# Patient Record
Sex: Female | Born: 1940 | Race: Black or African American | Hispanic: No | Smoking: Former smoker
Health system: Southern US, Community
[De-identification: ages and names within clinical notes are randomized; demographics above are authoritative.]

## PROBLEM LIST (undated history)

## (undated) DIAGNOSIS — IMO0001 Reserved for inherently not codable concepts without codable children: Secondary | ICD-10-CM

## (undated) DIAGNOSIS — M1990 Unspecified osteoarthritis, unspecified site: Secondary | ICD-10-CM

## (undated) DIAGNOSIS — C50919 Malignant neoplasm of unspecified site of unspecified female breast: Secondary | ICD-10-CM

## (undated) DIAGNOSIS — IMO0002 Reserved for concepts with insufficient information to code with codable children: Secondary | ICD-10-CM

## (undated) DIAGNOSIS — Z923 Personal history of irradiation: Secondary | ICD-10-CM

## (undated) DIAGNOSIS — I499 Cardiac arrhythmia, unspecified: Secondary | ICD-10-CM

## (undated) DIAGNOSIS — E119 Type 2 diabetes mellitus without complications: Secondary | ICD-10-CM

## (undated) DIAGNOSIS — I10 Essential (primary) hypertension: Secondary | ICD-10-CM

## (undated) HISTORY — PX: ABDOMINAL HYSTERECTOMY: SHX81

## (undated) HISTORY — DX: Type 2 diabetes mellitus without complications: E11.9

## (undated) HISTORY — DX: Essential (primary) hypertension: I10

## (undated) HISTORY — DX: Reserved for concepts with insufficient information to code with codable children: IMO0002

## (undated) HISTORY — DX: Reserved for inherently not codable concepts without codable children: IMO0001

## (undated) HISTORY — DX: Malignant neoplasm of unspecified site of unspecified female breast: C50.919

## (undated) HISTORY — PX: EYE SURGERY: SHX253

---

## 1998-11-26 ENCOUNTER — Other Ambulatory Visit: Admission: RE | Admit: 1998-11-26 | Discharge: 1998-11-26 | Payer: Self-pay | Admitting: Family Medicine

## 1998-12-09 ENCOUNTER — Ambulatory Visit (HOSPITAL_COMMUNITY): Admission: RE | Admit: 1998-12-09 | Discharge: 1998-12-09 | Payer: Self-pay | Admitting: Family Medicine

## 1998-12-09 ENCOUNTER — Encounter: Payer: Self-pay | Admitting: Family Medicine

## 2000-02-10 ENCOUNTER — Encounter: Payer: Self-pay | Admitting: Family Medicine

## 2000-02-10 ENCOUNTER — Ambulatory Visit (HOSPITAL_COMMUNITY): Admission: RE | Admit: 2000-02-10 | Discharge: 2000-02-10 | Payer: Self-pay | Admitting: Family Medicine

## 2001-02-13 ENCOUNTER — Ambulatory Visit (HOSPITAL_COMMUNITY): Admission: RE | Admit: 2001-02-13 | Discharge: 2001-02-13 | Payer: Self-pay | Admitting: Family Medicine

## 2001-02-13 ENCOUNTER — Encounter: Payer: Self-pay | Admitting: Family Medicine

## 2003-10-28 ENCOUNTER — Ambulatory Visit (HOSPITAL_COMMUNITY): Admission: RE | Admit: 2003-10-28 | Discharge: 2003-10-28 | Payer: Self-pay | Admitting: Family Medicine

## 2003-11-05 ENCOUNTER — Encounter: Admission: RE | Admit: 2003-11-05 | Discharge: 2003-11-05 | Payer: Self-pay | Admitting: Family Medicine

## 2003-11-07 ENCOUNTER — Encounter: Admission: RE | Admit: 2003-11-07 | Discharge: 2003-11-07 | Payer: Self-pay | Admitting: Family Medicine

## 2003-11-07 ENCOUNTER — Encounter (INDEPENDENT_AMBULATORY_CARE_PROVIDER_SITE_OTHER): Payer: Self-pay | Admitting: Specialist

## 2004-03-04 ENCOUNTER — Encounter: Admission: RE | Admit: 2004-03-04 | Discharge: 2004-03-04 | Payer: Self-pay | Admitting: Family Medicine

## 2004-12-08 ENCOUNTER — Encounter: Admission: RE | Admit: 2004-12-08 | Discharge: 2004-12-08 | Payer: Self-pay | Admitting: Family Medicine

## 2005-12-09 ENCOUNTER — Encounter: Admission: RE | Admit: 2005-12-09 | Discharge: 2005-12-09 | Payer: Self-pay | Admitting: Family Medicine

## 2006-12-12 ENCOUNTER — Encounter: Admission: RE | Admit: 2006-12-12 | Discharge: 2006-12-12 | Payer: Self-pay | Admitting: Family Medicine

## 2007-01-11 ENCOUNTER — Encounter: Admission: RE | Admit: 2007-01-11 | Discharge: 2007-01-19 | Payer: Self-pay | Admitting: Family Medicine

## 2007-12-13 ENCOUNTER — Encounter: Admission: RE | Admit: 2007-12-13 | Discharge: 2007-12-13 | Payer: Self-pay | Admitting: Family Medicine

## 2008-12-13 ENCOUNTER — Encounter: Admission: RE | Admit: 2008-12-13 | Discharge: 2008-12-13 | Payer: Self-pay | Admitting: Family Medicine

## 2009-12-15 ENCOUNTER — Encounter: Admission: RE | Admit: 2009-12-15 | Discharge: 2009-12-15 | Payer: Self-pay | Admitting: Family Medicine

## 2010-11-13 ENCOUNTER — Other Ambulatory Visit: Payer: Self-pay | Admitting: Family Medicine

## 2010-11-13 DIAGNOSIS — Z1231 Encounter for screening mammogram for malignant neoplasm of breast: Secondary | ICD-10-CM

## 2010-12-17 ENCOUNTER — Ambulatory Visit
Admission: RE | Admit: 2010-12-17 | Discharge: 2010-12-17 | Disposition: A | Discharge status: Home / Self Care | Payer: Medicare Other | Source: Ambulatory Visit | Attending: Family Medicine | Admitting: Family Medicine

## 2010-12-17 DIAGNOSIS — Z1231 Encounter for screening mammogram for malignant neoplasm of breast: Secondary | ICD-10-CM

## 2011-11-10 ENCOUNTER — Other Ambulatory Visit: Payer: Self-pay | Admitting: Family Medicine

## 2011-11-10 DIAGNOSIS — Z1231 Encounter for screening mammogram for malignant neoplasm of breast: Secondary | ICD-10-CM

## 2011-12-20 ENCOUNTER — Ambulatory Visit
Admission: RE | Admit: 2011-12-20 | Discharge: 2011-12-20 | Disposition: A | Discharge status: Home / Self Care | Payer: Medicare Other | Source: Ambulatory Visit | Attending: Family Medicine | Admitting: Family Medicine

## 2011-12-20 DIAGNOSIS — Z1231 Encounter for screening mammogram for malignant neoplasm of breast: Secondary | ICD-10-CM

## 2012-11-13 ENCOUNTER — Other Ambulatory Visit: Payer: Self-pay

## 2012-11-13 DIAGNOSIS — Z1231 Encounter for screening mammogram for malignant neoplasm of breast: Secondary | ICD-10-CM

## 2012-12-20 ENCOUNTER — Ambulatory Visit: Payer: Medicare Other

## 2012-12-21 ENCOUNTER — Other Ambulatory Visit: Payer: Self-pay

## 2012-12-21 ENCOUNTER — Ambulatory Visit
Admission: RE | Admit: 2012-12-21 | Discharge: 2012-12-21 | Disposition: A | Discharge status: Home / Self Care | Payer: Medicare Other | Source: Ambulatory Visit

## 2012-12-21 DIAGNOSIS — Z1231 Encounter for screening mammogram for malignant neoplasm of breast: Secondary | ICD-10-CM

## 2012-12-25 ENCOUNTER — Other Ambulatory Visit: Payer: Self-pay | Admitting: Family Medicine

## 2012-12-25 DIAGNOSIS — R928 Other abnormal and inconclusive findings on diagnostic imaging of breast: Secondary | ICD-10-CM

## 2013-01-05 ENCOUNTER — Other Ambulatory Visit: Payer: Self-pay

## 2013-01-05 ENCOUNTER — Ambulatory Visit
Admission: RE | Admit: 2013-01-05 | Discharge: 2013-01-05 | Disposition: A | Discharge status: Home / Self Care | Payer: Medicare Other | Source: Ambulatory Visit | Attending: Family Medicine | Admitting: Family Medicine

## 2013-01-05 DIAGNOSIS — R928 Other abnormal and inconclusive findings on diagnostic imaging of breast: Secondary | ICD-10-CM

## 2013-01-05 DIAGNOSIS — R921 Mammographic calcification found on diagnostic imaging of breast: Secondary | ICD-10-CM

## 2013-01-08 ENCOUNTER — Ambulatory Visit
Admission: RE | Admit: 2013-01-08 | Discharge: 2013-01-08 | Disposition: A | Discharge status: Home / Self Care | Payer: Medicare Other | Source: Ambulatory Visit

## 2013-01-08 DIAGNOSIS — R921 Mammographic calcification found on diagnostic imaging of breast: Secondary | ICD-10-CM

## 2013-01-10 ENCOUNTER — Other Ambulatory Visit: Payer: Self-pay | Admitting: Family Medicine

## 2013-01-10 DIAGNOSIS — D0512 Intraductal carcinoma in situ of left breast: Secondary | ICD-10-CM

## 2013-01-11 ENCOUNTER — Other Ambulatory Visit: Payer: Self-pay | Admitting: *Deleted

## 2013-01-11 ENCOUNTER — Telehealth: Payer: Self-pay | Admitting: *Deleted

## 2013-01-11 DIAGNOSIS — C50512 Malignant neoplasm of lower-outer quadrant of left female breast: Secondary | ICD-10-CM | POA: Insufficient documentation

## 2013-01-11 NOTE — Telephone Encounter (Signed)
Confirmed [facility] for 01/17/13 at 0800.  Instructions and contact information given.  Pt denies traveling to [place] or being around anyone that has done so in the last 30 days.

## 2013-01-17 ENCOUNTER — Encounter: Payer: Self-pay | Admitting: *Deleted

## 2013-01-17 ENCOUNTER — Ambulatory Visit
Admission: RE | Admit: 2013-01-17 | Discharge: 2013-01-17 | Disposition: A | Discharge status: Home / Self Care | Payer: Medicare Other | Source: Ambulatory Visit | Attending: Radiation Oncology | Admitting: Radiation Oncology

## 2013-01-17 ENCOUNTER — Other Ambulatory Visit (INDEPENDENT_AMBULATORY_CARE_PROVIDER_SITE_OTHER): Payer: Self-pay | Admitting: General Surgery

## 2013-01-17 ENCOUNTER — Ambulatory Visit (HOSPITAL_BASED_OUTPATIENT_CLINIC_OR_DEPARTMENT_OTHER): Payer: Medicare Other | Admitting: General Surgery

## 2013-01-17 ENCOUNTER — Ambulatory Visit: Payer: Medicare Other | Admitting: Physical Therapy

## 2013-01-17 ENCOUNTER — Ambulatory Visit (HOSPITAL_BASED_OUTPATIENT_CLINIC_OR_DEPARTMENT_OTHER): Payer: Medicare Other

## 2013-01-17 ENCOUNTER — Other Ambulatory Visit (HOSPITAL_BASED_OUTPATIENT_CLINIC_OR_DEPARTMENT_OTHER): Payer: Medicare Other

## 2013-01-17 ENCOUNTER — Encounter: Payer: Self-pay | Admitting: Oncology

## 2013-01-17 ENCOUNTER — Ambulatory Visit (HOSPITAL_BASED_OUTPATIENT_CLINIC_OR_DEPARTMENT_OTHER): Payer: Medicare Other | Admitting: Oncology

## 2013-01-17 VITALS — BP 172/95 | HR 101 | Temp 98.6°F | Resp 20 | Ht 63.0 in | Wt 193.9 lb

## 2013-01-17 DIAGNOSIS — C50519 Malignant neoplasm of lower-outer quadrant of unspecified female breast: Secondary | ICD-10-CM

## 2013-01-17 DIAGNOSIS — C50512 Malignant neoplasm of lower-outer quadrant of left female breast: Secondary | ICD-10-CM

## 2013-01-17 DIAGNOSIS — D0590 Unspecified type of carcinoma in situ of unspecified breast: Secondary | ICD-10-CM

## 2013-01-17 DIAGNOSIS — C50919 Malignant neoplasm of unspecified site of unspecified female breast: Secondary | ICD-10-CM

## 2013-01-17 DIAGNOSIS — C50912 Malignant neoplasm of unspecified site of left female breast: Secondary | ICD-10-CM

## 2013-01-17 DIAGNOSIS — Z170 Estrogen receptor positive status [ER+]: Secondary | ICD-10-CM

## 2013-01-17 LAB — COMPREHENSIVE METABOLIC PANEL (CC13)
ALT: 11 U/L (ref 0–55)
Albumin: 3.8 g/dL (ref 3.5–5.0)
Anion Gap: 13 mEq/L — ABNORMAL HIGH (ref 3–11)
Calcium: 10.1 mg/dL (ref 8.4–10.4)
Sodium: 138 mEq/L (ref 136–145)
Total Protein: 8.6 g/dL — ABNORMAL HIGH (ref 6.4–8.3)

## 2013-01-17 LAB — CBC WITH DIFFERENTIAL/PLATELET
BASO%: 0.7 % (ref 0.0–2.0)
EOS%: 0.7 % (ref 0.0–7.0)
Eosinophils Absolute: 0 10*3/uL (ref 0.0–0.5)
HGB: 14.6 g/dL (ref 11.6–15.9)
MCH: 30.8 pg (ref 25.1–34.0)
MCHC: 33.7 g/dL (ref 31.5–36.0)
MONO%: 10.3 % (ref 0.0–14.0)
Platelets: 435 10*3/uL — ABNORMAL HIGH (ref 145–400)
RBC: 4.74 10*6/uL (ref 3.70–5.45)
RDW: 13.9 % (ref 11.2–14.5)

## 2013-01-17 NOTE — Progress Notes (Signed)
Checked in new patient with no financial issues. She had two insurance cards. She will ck to see which is correct, but she paid the copay 45.00 instead of 40.00. Gave appt card and [facility] packet-she didn't have one.

## 2013-01-17 NOTE — Patient Instructions (Signed)
My office will call to schedule your surgery

## 2013-01-17 NOTE — Addendum Note (Signed)
Encounter addended by: Lurline Hare, MD on: 01/17/2013 11:34 AM<BR>     Documentation filed: Notes Section

## 2013-01-17 NOTE — Progress Notes (Addendum)
Radiation Oncology         [phone]) [phone] ________________________________  Initial outpatient Consultation - Date: 01/17/2013   Name: Erica Ballard MRN: [id number]   DOB: 1941-03-26  REFERRING PHYSICIAN: Adolph Pollack, MD  DIAGNOSIS: DCIS of the left breast  HISTORY OF PRESENT ILLNESS::Erica Ballard is a 72 y.o. female  who underwent a screening mammogram. She was found to have calcifications in the lower outer quadrant of the left breast. Is measured 2.8 x 2.7 x 2.8 cm. 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 The DCIS was ER positive PR positive. An MRI is scheduled for Friday. She has no history of breast cancer no symptoms prior to her mammogram. She has slight pain and itching in her left breast. She is accompanied by her daughter today. He is GX P4 with her first live birth at 55. She never used birth control pills. She never used hormone replacement. She had menses at 11 and had a hysterectomy in 1972.Marland Kitchen  PREVIOUS RADIATION THERAPY: No  PAST MEDICAL HISTORY:  has a past medical history of Breast cancer; Diabetes mellitus without complication; and Hypertension.    PAST SURGICAL HISTORY: Past Surgical History  Procedure Laterality Date  . Abdominal hysterectomy      FAMILY HISTORY:  Family History  Problem Relation Age of Onset  . Lung cancer Sister     SOCIAL HISTORY:  History  Substance Use Topics  . Smoking status: Former Games developer  . Smokeless tobacco: Not on file  . Alcohol Use: Yes    ALLERGIES: Review of patient's allergies indicates no known allergies.  MEDICATIONS:  Current Outpatient Prescriptions  Medication Sig Dispense Refill  . amLODipine (NORVASC) 5 MG tablet Take 5 mg by mouth 2 (two) times daily.      . carvedilol (COREG) 6.25 MG tablet Take 6.25 mg by mouth 2 (two) times daily.      . hydrochlorothiazide (HYDRODIURIL) 25 MG tablet Take 25  mg by mouth daily.      Marland Kitchen JANUMET 50-500 MG per tablet Take 2 tablets by mouth daily.      . Lancets 30G MISC       . PNEUMOVAX 23 25 MCG/0.5ML injection       . spironolactone (ALDACTONE) 25 MG tablet Take 25 mg by mouth 2 (two) times daily.       No current facility-administered medications for this encounter.    REVIEW OF SYSTEMS:  A 15 point review of systems is documented in the electronic medical record. This was obtained by the nursing staff. However, I reviewed this with the patient to discuss relevant findings and make appropriate changes.  Pertinent items are noted in HPI.  PHYSICAL EXAM: There were no vitals filed for this visit.. . ECoG performance status 0 she is a pleasant female in no distress sitting comfortably on examining table. She is alert minus x3. She has no palpable cervical or supraclavicular adenopathy. No palpable right or left axillary adenopathy. She has ptotic breasts bilaterally. She has no palpable abnormalities of the right breast. The left breast in the lower outer quadrant she has some biopsy change.  LABORATORY DATA:  Lab Results  Component Value Date   WBC 6.6 01/17/2013   HGB 14.6 01/17/2013   HCT 43.4 01/17/2013   MCV 91.5 01/17/2013   PLT 435* 01/17/2013   No results found for this basename: NA, K, CL, CO2   No results found  for this basename: ALT, AST, GGT, ALKPHOS, BILITOT     RADIOGRAPHY: [employer] L  01/10/2013   ADDENDUM REPORT: 01/10/2013 12:38  ADDENDUM: I talked with the patient today by phone. She states she is doing well since her stereotactic left breast biopsy without problems at the biopsy site. I gave her results of her biopsy which was compatible with low grade DCIS with [facility]. The pathology results are concordant with the imaging findings. The patient's immediate questions and concerns were addressed. She was given an appointment to the [facility] 01/17/2013 and an appointment for breast MRI 01/19/2013 at 9:30 a.m. The she was  notified that a representative from the [facility] would be contacting her regarding further information for her appointment.   Electronically Signed   By: Elberta Fortis M.D.   On: 01/10/2013 12:38   01/10/2013   CLINICAL DATA:  The patient presents for stereotactic core needle biopsy of a group of indeterminate microcalcifications over the over lower left breast. Previous benign left breast stereotactic needle biopsy 2005.  EXAM: STEREOTACTIC CORE NEEDLE BIOPSY; DIGITAL DIAGNOSTIC UNILATERAL LEFT MAMMOGRAM LIMITED  COMPARISON:  Previous exams.  FINDINGS: I met with the patient and we discussed the procedure of stereotactic-guided biopsy, including benefits and alternatives. We discussed the high likelihood of a successful procedure. We discussed the risks of the procedure, including infection, bleeding, tissue injury, clip migration, and inadequate sampling. Informed, written consent was given.  Using sterile technique and 2% Lidocaine as local anesthetic, under stereotactic guidance, a 12 gauge vacuum assisted device was used to perform core needle biopsy of calcifications in the lower outer quadrant of the left breast using a lateral to medial approach. Specimen radiograph was performed, showing multiple of the targeted microcalcifications. Specimens with calcifications are identified for pathology.  At the conclusion of the procedure, a hourglass shaped tissue marker clip was deployed into the biopsy cavity. Follow-up 2-view mammogram confirmed clip placement accurately at the biopsy site.  IMPRESSION: Stereotactic-guided biopsy of left breast microcalcifications. No apparent complications.  Electronically Signed: By: Elberta Fortis M.D. On: 01/08/2013 15:07   Mm Digital Screening  12/21/2012   *RADIOLOGY REPORT*  Clinical Data: Screening.  History of benign stereotactic breast biopsy on the left for calcifications in August 2005  DIGITAL SCREENING BILATERAL MAMMOGRAM WITH CAD  Comparison:  Previous exam(s).   FINDINGS:  ACR Breast Density Category c:  The breast tissue is heterogeneously dense, which may obscure small masses.  In the left breast, calcifications warrant further evaluation with magnified views.  In the right breast, there are no findings suspicious for malignancy.  Images were processed with CAD.  IMPRESSION: Further evaluation is suggested for calcifications in the left breast.  RECOMMENDATION: Diagnostic mammogram of the left breast. (Code:FI-L-9M)  The patient will be contacted regarding the findings, and additional imaging will be scheduled.  BI-RADS CATEGORY 0:  Incomplete.  Need additional imaging evaluation and/or prior mammograms for comparison.   Original Report Authenticated By: Rhodia Albright, M.D.   Mm Lt Breast Bx W Loc Dev 1st Lesion Image Bx Spec Stereo Guide  01/10/2013   ADDENDUM REPORT: 01/10/2013 12:38  ADDENDUM: I talked with the patient today by phone. She states she is doing well since her stereotactic left breast biopsy without problems at the biopsy site. I gave her results of her biopsy which was compatible with low grade DCIS with [facility]. The pathology results are concordant with the imaging findings. The patient's immediate questions and concerns were addressed. She was given  an appointment to the [facility] 01/17/2013 and an appointment for breast MRI 01/19/2013 at 9:30 a.m. The she was notified that a representative from the [facility] would be contacting her regarding further information for her appointment.   Electronically Signed   By: Elberta Fortis M.D.   On: 01/10/2013 12:38   01/10/2013   CLINICAL DATA:  The patient presents for stereotactic core needle biopsy of a group of indeterminate microcalcifications over the over lower left breast. Previous benign left breast stereotactic needle biopsy 2005.  EXAM: STEREOTACTIC CORE NEEDLE BIOPSY; DIGITAL DIAGNOSTIC UNILATERAL LEFT MAMMOGRAM LIMITED  COMPARISON:  Previous exams.  FINDINGS: I met with the patient and we discussed the  procedure of stereotactic-guided biopsy, including benefits and alternatives. We discussed the high likelihood of a successful procedure. We discussed the risks of the procedure, including infection, bleeding, tissue injury, clip migration, and inadequate sampling. Informed, written consent was given.  Using sterile technique and 2% Lidocaine as local anesthetic, under stereotactic guidance, a 12 gauge vacuum assisted device was used to perform core needle biopsy of calcifications in the lower outer quadrant of the left breast using a lateral to medial approach. Specimen radiograph was performed, showing multiple of the targeted microcalcifications. Specimens with calcifications are identified for pathology.  At the conclusion of the procedure, a hourglass shaped tissue marker clip was deployed into the biopsy cavity. Follow-up 2-view mammogram confirmed clip placement accurately at the biopsy site.  IMPRESSION: Stereotactic-guided biopsy of left breast microcalcifications. No apparent complications.  Electronically Signed: By: Elberta Fortis M.D. On: 01/08/2013 15:07      IMPRESSION: DCIS of the left breast  PLAN: I discussed with the patient and her daughter the role of radiation and decreasing local failures in patients who undergo breast conservation. We discussed the process of simulation the placement tattoos. We discussed 4-6 weeks of treatment as an outpatient. We discussed skin darkening and fatigue as the most common side effects. We discussed use of breath hold technique for cardiac sparing. We discussed the low rate of secondary malignancies. She will meet with surgery and medical oncology. She also met with a member of our patient family support staff. We discussed her excellent prognosis.  I spent 40 minutes  face to face with the patient and more than 50% of that time was spent in counseling and/or coordination of care.   ------------------------------------------------  Lurline Hare,  MD

## 2013-01-17 NOTE — Progress Notes (Signed)
Patient ID: Erica Ballard, female   DOB: 03/01/1941, 72 y.o.   MRN: [id number]  No chief complaint on file.   HPI Erica Ballard is a 72 y.o. female.   HPI   She is referred by Dr. Boyle because of newly diagnosed left breast ductal carcinoma in situ in the lower outer quadrant of the left breast.  She was noted to have an area of suspicious microcalcifications spanning 2.8 cm I'm screening mammogram. Image guided biopsy demonstrated low-grade ductal carcinoma in situ; it is ER and PR positive.  She is interested in breast conservation treatment. Age at menarche was 11, first live birth was at 22, she had a hysterectomy in 1972 and has stopped having periods. She denies using hormone replacement therapy. No family history of breast cancer. She has no breast complaints. She is here with her daughter.  Past Medical History  Diagnosis Date  . Breast cancer   . Diabetes mellitus without complication   . Hypertension     Past Surgical History  Procedure Laterality Date  . Abdominal hysterectomy      Family History  Problem Relation Age of Onset  . Lung cancer Sister     Social History History  Substance Use Topics  . Smoking status: Former Smoker  . Smokeless tobacco: Not on file  . Alcohol Use: Yes    No Known Allergies  Current Outpatient Prescriptions  Medication Sig Dispense Refill  . amLODipine (NORVASC) 5 MG tablet Take 5 mg by mouth 2 (two) times daily.      . carvedilol (COREG) 6.25 MG tablet Take 6.25 mg by mouth 2 (two) times daily.      . hydrochlorothiazide (HYDRODIURIL) 25 MG tablet Take 25 mg by mouth daily.      . JANUMET 50-500 MG per tablet Take 2 tablets by mouth daily.      . Lancets 30G MISC       . PNEUMOVAX 23 25 MCG/0.5ML injection       . spironolactone (ALDACTONE) 25 MG tablet Take 25 mg by mouth 2 (two) times daily.       No current facility-administered medications for this visit.    Review of Systems Review of Systems  Constitutional:  Negative.   HENT: Negative.   Respiratory: Negative.   Cardiovascular: Negative.   Gastrointestinal: Negative.   Genitourinary: Negative.   Musculoskeletal: Negative.   Neurological: Negative.   Hematological: Negative.     There were no vitals taken for this visit.  Physical Exam Physical Exam  Constitutional: She appears well-developed and well-nourished. No distress.  HENT:  Head: Normocephalic and atraumatic.  Eyes: EOM are normal. No scleral icterus.  Neck: Neck supple.  Cardiovascular: Normal rate and regular rhythm.   Pulmonary/Chest: Effort normal and breath sounds normal.  Right breast demonstrates no dominant palpable masses or suspicious skin changes. Left breast demonstrates a lower outer quadrant puncture wound with no palpable masses.  Abdominal: Soft. She exhibits no mass. There is no tenderness.  Lower transverse scar.  Musculoskeletal: She exhibits no edema.  No axillary or supraclavicular adenopathy.  Lymphadenopathy:    She has no cervical adenopathy.  Neurological: She is alert.  Skin: Skin is warm and dry.  Psychiatric: She has a normal mood and affect. Her behavior is normal.    Data Reviewed Mammogram, ultrasound.  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 MRI is due to be done on the 24th of this month. The   patient still wants to have the MRI. She is interested in breast conservation therapy. I think this is reasonable but I did tell her that she may have a cosmetic deformity and she is okay with this. She does not want a mastectomy. I do not think she is a sentinel lymph node biopsy.     Plan    Left breast lumpectomy after wire localization.  I have explained the procedure, risks, and aftercare to her.  Risks include but are not limited to bleeding, infection, wound problems, seroma formation, anesthesia.  She seems to understand and agrees with the plan.        Neco Kling J 01/17/2013, 11:18  AM    

## 2013-01-19 ENCOUNTER — Other Ambulatory Visit: Payer: Self-pay | Admitting: Oncology

## 2013-01-19 ENCOUNTER — Telehealth: Payer: Self-pay | Admitting: Oncology

## 2013-01-19 ENCOUNTER — Ambulatory Visit
Admission: RE | Admit: 2013-01-19 | Discharge: 2013-01-19 | Disposition: A | Discharge status: Home / Self Care | Payer: Medicare Other | Source: Ambulatory Visit | Attending: Family Medicine | Admitting: Family Medicine

## 2013-01-19 DIAGNOSIS — D0512 Intraductal carcinoma in situ of left breast: Secondary | ICD-10-CM

## 2013-01-19 MED ORDER — GADOBENATE DIMEGLUMINE 529 MG/ML IV SOLN
17.0000 mL | Freq: Once | INTRAVENOUS | Status: AC | PRN
  Administered 2013-01-19: 17 mL via INTRAVENOUS

## 2013-01-19 NOTE — Telephone Encounter (Signed)
lvmm appt d/t per Dr Princella Pellegrini POF dated 01/17/13 cal mailed also shh

## 2013-01-22 ENCOUNTER — Telehealth: Payer: Self-pay | Admitting: *Deleted

## 2013-01-22 NOTE — Telephone Encounter (Signed)
Spoke to pt concerning [facility] from 01/17/13.  Pt denies questions or concerns regarding dx or treatment care plan.  Pt asked when she will be having surgery.  Informed pt that Dr. Abbey Chatters will be reviewing her breast MRI and she should be scheduled by tomorrow.  Pt denies further needs.  Encourage pt to call with question or concerns.  Received verbal understanding.  Contact information given.

## 2013-01-22 NOTE — Progress Notes (Signed)
ID: Domenic Schwab OB: 1941/03/22  MR#: [id number]  CSN#:[id number]  PCP: Geraldo Pitter, MD GYN:   SU:  OTHER MD:  CHIEF COMPLAINT: "I think I breast cancer".  HISTORY OF PRESENT ILLNESS: The patient has yearly screening mammography, and on her screening mammogram at the breast center 12/21/2012 new calcifications in the left lower breast were noted. 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  Breast MRI was obtained 01/19/2013 and showed an area of spiculated abnormal enhancement in the lateral lower position of the left breast measuring 3.2 cm maximally. There was no abnormal appearing adenopathy. Iincidentally focally increased signal in the right manubrium and medial right clavicle was noted.   The patient's subsequent history is as detailed below.  INTERVAL HISTORY: Ellisha was evaluated in the multidisciplinary breast cancer clinic 01/18/2013 accompanied by her daughter [name].  REVIEW OF SYSTEMS: The patient had no symptoms leading to the screening mammogram, which was routinely scheduled. She denies unusual headaches, visual changes, dizziness, nausea, vomiting, cough, phlegm production, pleurisy, shortness of breath, hemoptysis, chest pain or pressure, or any change in bowel or bladder habits. There has not been any unexplained fatigue or weight loss, bleeding, fever, or rash. A detailed review of systems today was unremarkable.  PAST MEDICAL HISTORY: Past Medical History  Diagnosis Date  . Breast cancer   . Diabetes mellitus without complication   . Hypertension     PAST SURGICAL HISTORY: Past Surgical History  Procedure Laterality Date  . Abdominal hysterectomy      FAMILY HISTORY Family History  Problem Relation Age of Onset  . Lung cancer Sister    the patient has very little information about her parents.  She has 3 brothers and 2 sisters. One sister died at the age of 78 from lung cancer. There is no history of breast or ovarian cancer in the family to the patient's knowledge.  GYNECOLOGIC HISTORY:  Menarche age 49, first live birth age 39. The patient is GX P4. She stopped having periods approximately 1972. She never took hormone replacement. She never took contraceptives.  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 She is widowed. Her 4 children are: [facility] who works as a Medical laboratory scientific officer in [place]; Quintella Baton, who is a Acupuncturist; Carroll Sage, who is an Personnel officer; and Britlyn Martine who is a Company secretary. The patient has 4 grandchildren and one great-grandchild. She attends a local [employer]  ADVANCED DIRECTIVES: Not in place; the patient is considering name and her daughter [name] as healthcare power of attorney. Or you can be reached at [phone], [phone], or [phone].   HEALTH MAINTENANCE: History  Substance Use Topics  . Smoking status: Former Games developer  . Smokeless tobacco: Not on file  . Alcohol Use: Yes     Colonoscopy: 2009  PAP: 2010  Bone density: 2005, at [facility] hospital; normal  Lipid panel:  No Known Allergies  Current Outpatient Prescriptions  Medication Sig Dispense Refill  . amLODipine (NORVASC) 5 MG tablet Take 5 mg by mouth 2 (two) times daily.      . carvedilol (COREG) 6.25 MG tablet Take 6.25 mg by mouth 2 (two) times daily.      . hydrochlorothiazide (HYDRODIURIL) 25 MG tablet Take 25 mg by mouth daily.      Marland Kitchen JANUMET 50-500 MG per tablet  Take 2 tablets by mouth daily.      . Lancets 30G MISC       . PNEUMOVAX 23 25 MCG/0.5ML injection       . spironolactone (ALDACTONE) 25 MG tablet Take 25 mg by mouth 2 (two) times daily.       No current facility-administered medications for this visit.    OBJECTIVE: Older African American woman in no acute distress Filed Vitals:   01/17/13 0900  BP:  172/95  Pulse: 101  Temp: 98.6 F (37 C)  Resp: 20     Body mass index is 34.36 kg/(m^2).    ECOG FS:0 - Asymptomatic  Ocular: Sclerae unicteric, pupils equal, round and reactive to light Ear-nose-throat: Oropharynx clear, dentition fair Lymphatic: No cervical or supraclavicular adenopathy Lungs no rales or rhonchi, good excursion bilaterally Heart regular rate and rhythm, no murmur appreciated Abd soft, nontender, positive bowel sounds MSK no focal spinal tenderness, no joint edema; the manubrium and right clavicle were not tender to palpation Neuro: non-focal, well-oriented, appropriate affect Breasts: The right breast is unremarkable. The left breast is status post recent biopsy. I do not palpate a well-defined mass. The left axilla is benign   LAB RESULTS:  CMP     Component Value Date/Time   NA 138 01/17/2013 0814   K 3.8 01/17/2013 0814   CO2 23 01/17/2013 0814   GLUCOSE 118 01/17/2013 0814   BUN 18.1 01/17/2013 0814   CREATININE 1.1 01/17/2013 0814   CALCIUM 10.1 01/17/2013 0814   PROT 8.6* 01/17/2013 0814   ALBUMIN 3.8 01/17/2013 0814   AST 13 01/17/2013 0814   ALT 11 01/17/2013 0814   ALKPHOS 48 01/17/2013 0814   BILITOT 0.47 01/17/2013 0814    I No results found for this basename: SPEP, UPEP,  kappa and lambda light chains    Lab Results  Component Value Date   WBC 6.6 01/17/2013   NEUTROABS 4.5 01/17/2013   HGB 14.6 01/17/2013   HCT 43.4 01/17/2013   MCV 91.5 01/17/2013   PLT 435* 01/17/2013      Chemistry      Component Value Date/Time   NA 138 01/17/2013 0814   K 3.8 01/17/2013 0814   CO2 23 01/17/2013 0814   BUN 18.1 01/17/2013 0814   CREATININE 1.1 01/17/2013 0814      Component Value Date/Time   CALCIUM 10.1 01/17/2013 0814   ALKPHOS 48 01/17/2013 0814   AST 13 01/17/2013 0814   ALT 11 01/17/2013 0814   BILITOT 0.47 01/17/2013 0814       No results found for this basename: LABCA2    No components found with this basename:  LABCA125    No results found for this basename: INR,  in the last 168 hours  Urinalysis No results found for this basename: colorurine, appearanceur, labspec, phurine, glucoseu, hgbur, bilirubinur, ketonesur, proteinur, urobilinogen, nitrite, leukocytesur    STUDIES: Mr Breast Bilateral W Wo Contrast  01/19/2013   CLINICAL DATA:  Recent diagnosis of left breast cancer.  EXAM: MR BILATERAL BREAST WITHOUT AND WITH CONTRAST  LABS:  BUN and creatinine were obtained on site at [facility] at  [address]  Results:  BUN 16 mg/dL,  Creatinine 0.9 mg/dL. GFR is 74  TECHNIQUE: Multiplanar, multisequence MR images of both breasts were obtained prior to and following the intravenous administration of 17ml of MultiHance.  THREE-DIMENSIONAL MR IMAGE RENDERING ON INDEPENDENT WORKSTATION:  Three-dimensional MR images were rendered by post-processing of the original  MR data on an independent workstation. The three-dimensional MR images were interpreted, and findings are reported in the following complete MRI report for this study.  COMPARISON:  Previous exams  FINDINGS: Breast composition: c:  Heterogeneous fibroglandular tissue  Background parenchymal enhancement: Moderate  Right breast: No mass or abnormal enhancement.  Left breast: There is spiculated abnormal enhancement in the lateral lower posterior 1/3 left breast measuring 2.3 diameter, 3.2 anterior-posterior, 3 cm craniocaudal dimension with biopsy clip in place. This mass has washout kinetics. This is consistent with patient's known breast cancer.  Lymph nodes: No abnormal appearing lymph nodes.  Ancillary findings: There is focal increased T2 signal in the right-sided manubrium and medial right clavicle. This is nonspecific. If clinically indicated, bone scan or dedicated MR can be obtained for further evaluation.  IMPRESSION: Known left breast cancer measuring 2.3 x 3.2 x 3 cm as described. Focal abnormal increased T2 signal in the right side  manubrium and medial right clavicle, nonspecific.  RECOMMENDATION: Treatment plan.  BI-RADS CATEGORY  6: Known biopsy-proven malignancy - appropriate action should be taken.   Electronically Signed   By: Sherian Rein M.D.   On: 01/19/2013 12:56   Mm [employer] L  01/10/2013   ADDENDUM REPORT: 01/10/2013 12:38  ADDENDUM: I talked with the patient today by phone. She states she is doing well since her stereotactic left breast biopsy without problems at the biopsy site. I gave her results of her biopsy which was compatible with low grade DCIS with [facility]. The pathology results are concordant with the imaging findings. The patient's immediate questions and concerns were addressed. She was given an appointment to the [facility] 01/17/2013 and an appointment for breast MRI 01/19/2013 at 9:30 a.m. The she was notified that a representative from the [facility] would be contacting her regarding further information for her appointment.   Electronically Signed   By: Elberta Fortis M.D.   On: 01/10/2013 12:38   01/10/2013   CLINICAL DATA:  The patient presents for stereotactic core needle biopsy of a group of indeterminate microcalcifications over the over lower left breast. Previous benign left breast stereotactic needle biopsy 2005.  EXAM: STEREOTACTIC CORE NEEDLE BIOPSY; DIGITAL DIAGNOSTIC UNILATERAL LEFT MAMMOGRAM LIMITED  COMPARISON:  Previous exams.  FINDINGS: I met with the patient and we discussed the procedure of stereotactic-guided biopsy, including benefits and alternatives. We discussed the high likelihood of a successful procedure. We discussed the risks of the procedure, including infection, bleeding, tissue injury, clip migration, and inadequate sampling. Informed, written consent was given.  Using sterile technique and 2% Lidocaine as local anesthetic, under stereotactic guidance, a 12 gauge vacuum assisted device was used to perform core needle biopsy of calcifications in the lower outer quadrant of the left breast  using a lateral to medial approach. Specimen radiograph was performed, showing multiple of the targeted microcalcifications. Specimens with calcifications are identified for pathology.  At the conclusion of the procedure, a hourglass shaped tissue marker clip was deployed into the biopsy cavity. Follow-up 2-view mammogram confirmed clip placement accurately at the biopsy site.  IMPRESSION: Stereotactic-guided biopsy of left breast microcalcifications. No apparent complications.  Electronically Signed: By: Elberta Fortis M.D. On: 01/08/2013 15:07   Mm Lt Breast Bx W Loc Dev 1st Lesion Image Bx Spec Stereo Guide  01/10/2013   ADDENDUM REPORT: 01/10/2013 12:38  ADDENDUM: I talked with the patient today by phone. She states she is doing well since her stereotactic left breast biopsy without problems at the biopsy  site. I gave her results of her biopsy which was compatible with low grade DCIS with [facility]. The pathology results are concordant with the imaging findings. The patient's immediate questions and concerns were addressed. She was given an appointment to the [facility] 01/17/2013 and an appointment for breast MRI 01/19/2013 at 9:30 a.m. The she was notified that a representative from the [facility] would be contacting her regarding further information for her appointment.   Electronically Signed   By: Elberta Fortis M.D.   On: 01/10/2013 12:38   01/10/2013   CLINICAL DATA:  The patient presents for stereotactic core needle biopsy of a group of indeterminate microcalcifications over the over lower left breast. Previous benign left breast stereotactic needle biopsy 2005.  EXAM: STEREOTACTIC CORE NEEDLE BIOPSY; DIGITAL DIAGNOSTIC UNILATERAL LEFT MAMMOGRAM LIMITED  COMPARISON:  Previous exams.  FINDINGS: I met with the patient and we discussed the procedure of stereotactic-guided biopsy, including benefits and alternatives. We discussed the high likelihood of a successful procedure. We discussed the risks of the procedure,  including infection, bleeding, tissue injury, clip migration, and inadequate sampling. Informed, written consent was given.  Using sterile technique and 2% Lidocaine as local anesthetic, under stereotactic guidance, a 12 gauge vacuum assisted device was used to perform core needle biopsy of calcifications in the lower outer quadrant of the left breast using a lateral to medial approach. Specimen radiograph was performed, showing multiple of the targeted microcalcifications. Specimens with calcifications are identified for pathology.  At the conclusion of the procedure, a hourglass shaped tissue marker clip was deployed into the biopsy cavity. Follow-up 2-view mammogram confirmed clip placement accurately at the biopsy site.  IMPRESSION: Stereotactic-guided biopsy of left breast microcalcifications. No apparent complications.  Electronically Signed: By: Elberta Fortis M.D. On: 01/08/2013 15:07    ASSESSMENT: 72 y.o. [place] woman status post left breast biopsy 01/08/2013 for a low-grade ductal carcinoma in situ which was 100% estrogen receptor positive, 10% progesterone receptor positive.  PLAN: We spent the better part of today's hour-long appointment discussing the biology of breast cancer in general, and the specifics of the patient's tumor in particular.  The patient understands that in noninvasive breast cancer is in itself not life-threatening, as the cancer cells cannot travel to vital organ. Accordingly she is not putting herself at risk by keeping her breast, which would be our recommendation even if she had invasive disease.  Depending on her surgical results, she will likely need radiation. Once that is completed, she will be a candidate for antiestrogen therapy.  Today we discussed anti-estrogens in general terms and she was introduced to the possible toxicities, side effects and complications of tamoxifen versus the aromatase inhibitors, as well as the possible benefits. The patient will  return to see me in mid December, bywhich time she will have completed her radiation treatments. We will then decide if she indeed wants to proceed with anti-estrogen therapy, and which a GEN she would prefer.  I am going to obtain an SPEP and lambda light chains before her December appointment here, just in case, as there is a mild increase in her total protein.  Azilee knows to call us for any problems that may develop before her next visit here.  Lowella Dell, MD   01/22/2013 2:04 PM

## 2013-01-25 ENCOUNTER — Telehealth: Payer: Self-pay | Admitting: Oncology

## 2013-01-25 NOTE — Telephone Encounter (Signed)
LVMM of lab appt 12/2 calendar mailed shh

## 2013-01-29 ENCOUNTER — Encounter: Payer: Self-pay | Admitting: *Deleted

## 2013-01-29 NOTE — Progress Notes (Signed)
[facility] Psychosocial Distress Screening Clinical Social Work  Patient completed distress screening protocol, and scored a 2 on the Psychosocial Distress Thermometer which indicates mild distress. Clinical Social Worker met with pt in [facility] (01/17/13) to assess for distress and other psychosocial needs.  Pt stated she confident after meeting with the physicians and getting more information on her treatment plan.  CSW informed pt of the support team and support services at [facility], and encouraged pt to call with any questions or concerns.  Tamala Julian, MSW, LCSW Clinical Social Worker [facility] [phone]

## 2013-02-02 ENCOUNTER — Encounter (HOSPITAL_COMMUNITY)
Admission: RE | Admit: 2013-02-02 | Discharge: 2013-02-02 | Disposition: A | Discharge status: Home / Self Care | Payer: Medicare Other | Source: Ambulatory Visit | Attending: General Surgery | Admitting: General Surgery

## 2013-02-02 ENCOUNTER — Other Ambulatory Visit (HOSPITAL_COMMUNITY): Payer: Self-pay | Admitting: *Deleted

## 2013-02-02 ENCOUNTER — Encounter (HOSPITAL_COMMUNITY): Payer: Self-pay

## 2013-02-02 DIAGNOSIS — Z01812 Encounter for preprocedural laboratory examination: Secondary | ICD-10-CM | POA: Insufficient documentation

## 2013-02-02 DIAGNOSIS — Z01818 Encounter for other preprocedural examination: Secondary | ICD-10-CM | POA: Insufficient documentation

## 2013-02-02 HISTORY — DX: Cardiac arrhythmia, unspecified: I49.9

## 2013-02-02 HISTORY — DX: Unspecified osteoarthritis, unspecified site: M19.90

## 2013-02-02 LAB — BASIC METABOLIC PANEL
BUN: 20 mg/dL (ref 6–23)
CO2: 22 mEq/L (ref 19–32)
Chloride: 100 mEq/L (ref 96–112)
Creatinine, Ser: 1.17 mg/dL — ABNORMAL HIGH (ref 0.50–1.10)
GFR calc non Af Amer: 45 mL/min — ABNORMAL LOW (ref >90)
Glucose, Bld: 113 mg/dL — ABNORMAL HIGH (ref 70–99)
Sodium: 137 mEq/L (ref 135–145)

## 2013-02-02 LAB — CBC
HCT: 42.7 % (ref 36.0–46.0)
Hemoglobin: 15 g/dL (ref 12.0–15.0)
MCH: 32.1 pg (ref 26.0–34.0)
MCV: 91.4 fL (ref 78.0–100.0)
RBC: 4.67 MIL/uL (ref 3.87–5.11)
RDW: 13.5 % (ref 11.5–15.5)
WBC: 6.8 10*3/uL (ref 4.0–10.5)

## 2013-02-02 NOTE — Pre-Procedure Instructions (Addendum)
Erica Ballard  02/02/2013   Your procedure is scheduled on:  02/06/13  Report to [facility] short stay admitting at 1130 or when through at breast center AM.  Call this number if you have problems the morning of surgery: [phone]   Remember:   Do not eat food or drink liquids after midnight.   Take these medicines the morning of surgery with A SIP OF WATER: amlodipine, carvedilol   Do not wear jewelry, make-up or nail polish.  Do not wear lotions, powders, or perfumes. You may wear deodorant.  Do not shave 48 hours prior to surgery. Men may shave face and neck.  Do not bring valuables to the hospital.  [facility] is not responsible                  for any belongings or valuables.               Contacts, dentures or bridgework may not be worn into surgery.  Leave suitcase in the car. After surgery it may be brought to your room.  For patients admitted to the hospital, discharge time is determined by your                treatment team.               Patients discharged the day of surgery will not be allowed to drive  home.  Name and phone number of your driver:   Special Instructions: Shower using CHG 2 nights before surgery and the night before surgery.  If you shower the day of surgery use CHG.  Use special wash - you have one bottle of CHG for all showers.  You should use approximately 1/3 of the bottle for each shower.   Please read over the following fact sheets that you were given: Pain Booklet, Coughing and Deep Breathing and Surgical Site Infection Prevention

## 2013-02-05 MED ORDER — CEFAZOLIN SODIUM-DEXTROSE 2-3 GM-% IV SOLR
2.0000 g | INTRAVENOUS | Status: AC
  Administered 2013-02-06: 2 g via INTRAVENOUS
  Filled 2013-02-05: qty 50

## 2013-02-06 ENCOUNTER — Encounter (HOSPITAL_COMMUNITY): Payer: Self-pay | Admitting: *Deleted

## 2013-02-06 ENCOUNTER — Ambulatory Visit
Admission: RE | Admit: 2013-02-06 | Discharge: 2013-02-06 | Disposition: A | Discharge status: Home / Self Care | Payer: Medicare Other | Source: Ambulatory Visit | Attending: General Surgery | Admitting: General Surgery

## 2013-02-06 ENCOUNTER — Encounter (HOSPITAL_COMMUNITY): Payer: Medicare Other | Admitting: Certified Registered Nurse Anesthetist

## 2013-02-06 ENCOUNTER — Ambulatory Visit (HOSPITAL_COMMUNITY): Payer: Medicare Other | Admitting: Certified Registered Nurse Anesthetist

## 2013-02-06 ENCOUNTER — Ambulatory Visit (HOSPITAL_COMMUNITY)
Admission: RE | Admit: 2013-02-06 | Discharge: 2013-02-06 | Disposition: A | Discharge status: Home / Self Care | Payer: Medicare Other | Source: Ambulatory Visit | Attending: General Surgery | Admitting: General Surgery

## 2013-02-06 ENCOUNTER — Encounter (HOSPITAL_COMMUNITY)
Admission: RE | Disposition: A | Discharge status: Home / Self Care | Payer: Self-pay | Source: Ambulatory Visit | Attending: General Surgery

## 2013-02-06 DIAGNOSIS — D0590 Unspecified type of carcinoma in situ of unspecified breast: Secondary | ICD-10-CM | POA: Insufficient documentation

## 2013-02-06 DIAGNOSIS — C50912 Malignant neoplasm of unspecified site of left female breast: Secondary | ICD-10-CM

## 2013-02-06 DIAGNOSIS — I10 Essential (primary) hypertension: Secondary | ICD-10-CM | POA: Insufficient documentation

## 2013-02-06 DIAGNOSIS — E119 Type 2 diabetes mellitus without complications: Secondary | ICD-10-CM | POA: Insufficient documentation

## 2013-02-06 HISTORY — PX: BREAST LUMPECTOMY: SHX2

## 2013-02-06 HISTORY — PX: BREAST LUMPECTOMY WITH NEEDLE LOCALIZATION: SHX5759

## 2013-02-06 LAB — GLUCOSE, CAPILLARY: Glucose-Capillary: 126 mg/dL — ABNORMAL HIGH (ref 70–99)

## 2013-02-06 SURGERY — BREAST LUMPECTOMY WITH NEEDLE LOCALIZATION
Anesthesia: General | Site: Breast | Laterality: Left | Wound class: Clean

## 2013-02-06 MED ORDER — OXYCODONE HCL 5 MG PO TABS: 5.0000 mg | ORAL_TABLET | Freq: Once | ORAL | Status: DC | PRN

## 2013-02-06 MED ORDER — OXYCODONE HCL 5 MG/5ML PO SOLN: 5.0000 mg | Freq: Once | ORAL | Status: DC | PRN

## 2013-02-06 MED ORDER — BUPIVACAINE HCL (PF) 0.25 % IJ SOLN
INTRAMUSCULAR | Status: DC | PRN
  Administered 2013-02-06: 14 mL

## 2013-02-06 MED ORDER — FENTANYL CITRATE 0.05 MG/ML IJ SOLN
INTRAMUSCULAR | Status: DC | PRN
  Administered 2013-02-06 (×2): 50 ug via INTRAVENOUS

## 2013-02-06 MED ORDER — BUPIVACAINE HCL (PF) 0.25 % IJ SOLN
INTRAMUSCULAR | Status: AC
  Filled 2013-02-06: qty 30

## 2013-02-06 MED ORDER — LACTATED RINGERS IV SOLN
INTRAVENOUS | Status: DC | PRN
  Administered 2013-02-06 (×2): via INTRAVENOUS

## 2013-02-06 MED ORDER — MIDAZOLAM HCL 2 MG/2ML IJ SOLN: 1.0000 mg | INTRAMUSCULAR | Status: DC | PRN

## 2013-02-06 MED ORDER — HYDROCODONE-ACETAMINOPHEN 5-325 MG PO TABS: 1.0000 | ORAL_TABLET | ORAL | Status: DC | PRN

## 2013-02-06 MED ORDER — LACTATED RINGERS IV SOLN
Freq: Once | INTRAVENOUS | Status: AC
  Administered 2013-02-06: 13:00:00 via INTRAVENOUS

## 2013-02-06 MED ORDER — LIDOCAINE HCL (CARDIAC) 20 MG/ML IV SOLN
INTRAVENOUS | Status: DC | PRN
  Administered 2013-02-06: 80 mg via INTRAVENOUS

## 2013-02-06 MED ORDER — ONDANSETRON HCL 4 MG/2ML IJ SOLN
INTRAMUSCULAR | Status: DC | PRN
  Administered 2013-02-06: 4 mg via INTRAVENOUS

## 2013-02-06 MED ORDER — FENTANYL CITRATE 0.05 MG/ML IJ SOLN: 25.0000 ug | INTRAMUSCULAR | Status: DC | PRN

## 2013-02-06 MED ORDER — MIDAZOLAM HCL 5 MG/5ML IJ SOLN
INTRAMUSCULAR | Status: DC | PRN
  Administered 2013-02-06: 2 mg via INTRAVENOUS

## 2013-02-06 MED ORDER — PROPOFOL 10 MG/ML IV BOLUS
INTRAVENOUS | Status: DC | PRN
  Administered 2013-02-06: 50 mg via INTRAVENOUS
  Administered 2013-02-06: 150 mg via INTRAVENOUS

## 2013-02-06 MED ORDER — 0.9 % SODIUM CHLORIDE (POUR BTL) OPTIME
TOPICAL | Status: DC | PRN
  Administered 2013-02-06: 1000 mL

## 2013-02-06 MED ORDER — FENTANYL CITRATE 0.05 MG/ML IJ SOLN: 50.0000 ug | Freq: Once | INTRAMUSCULAR | Status: DC

## 2013-02-06 MED ORDER — EPHEDRINE SULFATE 50 MG/ML IJ SOLN
INTRAMUSCULAR | Status: DC | PRN
  Administered 2013-02-06: 5 mg via INTRAVENOUS

## 2013-02-06 SURGICAL SUPPLY — 43 items
APL SKNCLS STERI-STRIP NONHPOA (GAUZE/BANDAGES/DRESSINGS) ×1
BENZOIN TINCTURE PRP APPL 2/3 (GAUZE/BANDAGES/DRESSINGS) ×2 IMPLANT
BINDER BREAST LRG (GAUZE/BANDAGES/DRESSINGS) IMPLANT
BINDER BREAST XLRG (GAUZE/BANDAGES/DRESSINGS) ×1 IMPLANT
BLADE SURG 10 STRL SS (BLADE) ×2 IMPLANT
BLADE SURG 15 STRL LF DISP TIS (BLADE) ×1 IMPLANT
BLADE SURG 15 STRL SS (BLADE) ×2
CANISTER SUCTION 2500CC (MISCELLANEOUS) ×2 IMPLANT
CHLORAPREP W/TINT 26ML (MISCELLANEOUS) ×2 IMPLANT
COVER SURGICAL LIGHT HANDLE (MISCELLANEOUS) ×2 IMPLANT
DECANTER SPIKE VIAL GLASS SM (MISCELLANEOUS) ×2 IMPLANT
DEVICE DUBIN SPECIMEN MAMMOGRA (MISCELLANEOUS) ×2 IMPLANT
DRAPE CHEST BREAST 15X10 FENES (DRAPES) ×2 IMPLANT
DRAPE UTILITY 15X26 W/TAPE STR (DRAPE) ×4 IMPLANT
DRSG OPSITE 4X5.5 SM (GAUZE/BANDAGES/DRESSINGS) ×2 IMPLANT
ELECT CAUTERY BLADE 6.4 (BLADE) ×2 IMPLANT
ELECT REM PT RETURN 9FT ADLT (ELECTROSURGICAL) ×2
ELECTRODE REM PT RTRN 9FT ADLT (ELECTROSURGICAL) ×1 IMPLANT
GLOVE BIO SURGEON STRL SZ7 (GLOVE) ×1 IMPLANT
GLOVE BIOGEL PI IND STRL 7.5 (GLOVE) IMPLANT
GLOVE BIOGEL PI IND STRL 8 (GLOVE) ×1 IMPLANT
GLOVE BIOGEL PI INDICATOR 7.5 (GLOVE) ×1
GLOVE BIOGEL PI INDICATOR 8 (GLOVE) ×1
GLOVE ECLIPSE 8.0 STRL XLNG CF (GLOVE) ×2 IMPLANT
GOWN STRL NON-REIN LRG LVL3 (GOWN DISPOSABLE) ×4 IMPLANT
KIT BASIN OR (CUSTOM PROCEDURE TRAY) ×2 IMPLANT
KIT ROOM TURNOVER OR (KITS) ×2 IMPLANT
NDL HYPO 25GX1X1/2 BEV (NEEDLE) ×1 IMPLANT
NEEDLE HYPO 25GX1X1/2 BEV (NEEDLE) ×2 IMPLANT
NS IRRIG 1000ML POUR BTL (IV SOLUTION) ×2 IMPLANT
PACK SURGICAL SETUP 50X90 (CUSTOM PROCEDURE TRAY) ×2 IMPLANT
PAD ARMBOARD 7.5X6 YLW CONV (MISCELLANEOUS) ×2 IMPLANT
PENCIL BUTTON HOLSTER BLD 10FT (ELECTRODE) ×2 IMPLANT
SPONGE GAUZE 4X4 12PLY (GAUZE/BANDAGES/DRESSINGS) ×2 IMPLANT
SPONGE LAP 4X18 X RAY DECT (DISPOSABLE) ×2 IMPLANT
STRIP CLOSURE SKIN 1/2X4 (GAUZE/BANDAGES/DRESSINGS) ×2 IMPLANT
SUT MNCRL AB 4-0 PS2 18 (SUTURE) ×2 IMPLANT
SUT VIC AB 3-0 SH 18 (SUTURE) ×2 IMPLANT
SYR CONTROL 10ML LL (SYRINGE) ×2 IMPLANT
TOWEL OR 17X24 6PK STRL BLUE (TOWEL DISPOSABLE) ×2 IMPLANT
TOWEL OR 17X26 10 PK STRL BLUE (TOWEL DISPOSABLE) ×2 IMPLANT
TUBE CONNECTING 12X1/4 (SUCTIONS) ×2 IMPLANT
YANKAUER SUCT BULB TIP NO VENT (SUCTIONS) ×2 IMPLANT

## 2013-02-06 NOTE — Preoperative (Signed)
Beta Blockers   Reason not to administer Beta Blockers:Not Applicable, took this AM 

## 2013-02-06 NOTE — Interval H&P Note (Signed)
History and Physical Interval Note:  02/06/2013 1:52 PM  Erica Ballard  has presented today for surgery, with the diagnosis of DCIS left breast  The various methods of treatment have been discussed with the patient and family. After consideration of risks, benefits and other options for treatment, the patient has consented to  Procedure(s): BREAST LUMPECTOMY WITH NEEDLE LOCALIZATION (Left) as a surgical intervention .  The patient's history has been reviewed, patient examined, no change in status, stable for surgery.  I have reviewed the patient's chart and labs.  Questions were answered to the patient's satisfaction.     Zuly Belkin Shela [facility]

## 2013-02-06 NOTE — H&P (View-Only) (Signed)
Patient ID: Erica Ballard, female   DOB: [birth year], 72 y.o.   MRN: [id number]  No chief complaint on file.   HPI Erica Ballard is a 72 y.o. female.   HPI   She is referred by Dr. Micheline Maze because of newly diagnosed left breast ductal carcinoma in situ in the lower outer quadrant of the left breast.  She was noted to have an area of suspicious microcalcifications spanning 2.8 cm I'm screening mammogram. Image guided biopsy demonstrated low-grade ductal carcinoma in situ; it is ER and PR positive.  She is interested in breast conservation treatment. Age at menarche was 27, first live birth was at 25, she had a hysterectomy in 55 and has stopped having periods. She denies using hormone replacement therapy. No family history of breast cancer. She has no breast complaints. She is here with her daughter.  Past Medical History  Diagnosis Date  . Breast cancer   . Diabetes mellitus without complication   . Hypertension     Past Surgical History  Procedure Laterality Date  . Abdominal hysterectomy      Family History  Problem Relation Age of Onset  . Lung cancer Sister     Social History History  Substance Use Topics  . Smoking status: Former Games developer  . Smokeless tobacco: Not on file  . Alcohol Use: Yes    No Known Allergies  Current Outpatient Prescriptions  Medication Sig Dispense Refill  . amLODipine (NORVASC) 5 MG tablet Take 5 mg by mouth 2 (two) times daily.      . carvedilol (COREG) 6.25 MG tablet Take 6.25 mg by mouth 2 (two) times daily.      . hydrochlorothiazide (HYDRODIURIL) 25 MG tablet Take 25 mg by mouth daily.      Marland Kitchen JANUMET 50-500 MG per tablet Take 2 tablets by mouth daily.      . Lancets 30G MISC       . PNEUMOVAX 23 25 MCG/0.5ML injection       . spironolactone (ALDACTONE) 25 MG tablet Take 25 mg by mouth 2 (two) times daily.       No current facility-administered medications for this visit.    Review of Systems Review of Systems  Constitutional:  Negative.   HENT: Negative.   Respiratory: Negative.   Cardiovascular: Negative.   Gastrointestinal: Negative.   Genitourinary: Negative.   Musculoskeletal: Negative.   Neurological: Negative.   Hematological: Negative.     There were no vitals taken for this visit.  Physical Exam Physical Exam  Constitutional: She appears well-developed and well-nourished. No distress.  HENT:  Head: Normocephalic and atraumatic.  Eyes: EOM are normal. No scleral icterus.  Neck: Neck supple.  Cardiovascular: Normal rate and regular rhythm.   Pulmonary/Chest: Effort normal and breath sounds normal.  Right breast demonstrates no dominant palpable masses or suspicious skin changes. Left breast demonstrates a lower outer quadrant puncture wound with no palpable masses.  Abdominal: Soft. She exhibits no mass. There is no tenderness.  Lower transverse scar.  Musculoskeletal: She exhibits no edema.  No axillary or supraclavicular adenopathy.  Lymphadenopathy:    She has no cervical adenopathy.  Neurological: She is alert.  Skin: Skin is warm and dry.  Psychiatric: She has a normal mood and affect. Her behavior is normal.    Data Reviewed Mammogram, ultrasound.  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 MRI is due to be done on the 24th of this month. The  patient still wants to have the MRI. She is interested in breast conservation therapy. I think this is reasonable but I did tell her that she may have a cosmetic deformity and she is okay with this. She does not want a mastectomy. I do not think she is a sentinel lymph node biopsy.     Plan    Left breast lumpectomy after wire localization.  I have explained the procedure, risks, and aftercare to her.  Risks include but are not limited to bleeding, infection, wound problems, seroma formation, anesthesia.  She seems to understand and agrees with the plan.        Rickie Gutierres J 01/17/2013, 11:18  AM

## 2013-02-06 NOTE — Anesthesia Postprocedure Evaluation (Signed)
  Anesthesia Post-op Note  Patient: Erica Ballard  Procedure(s) Performed: Procedure(s): BREAST LUMPECTOMY WITH NEEDLE LOCALIZATION (Left)  Patient Location: PACU  Anesthesia Type:General  Level of Consciousness: awake, alert  and oriented  Airway and Oxygen Therapy: Patient Spontanous Breathing  Post-op Pain: mild  Post-op Assessment: Post-op Vital signs reviewed  Post-op Vital Signs: Reviewed  Complications: No apparent anesthesia complications

## 2013-02-06 NOTE — Anesthesia Preprocedure Evaluation (Addendum)
Anesthesia Evaluation  Patient identified by MRN, date of birth, ID band Patient awake    Reviewed: Allergy & Precautions, H&P , NPO status , Patient's Chart, lab work & pertinent test results  Airway Mallampati: II TM Distance: >3 FB Neck ROM: Full    Dental   Pulmonary former smoker,  breath sounds clear to auscultation        Cardiovascular hypertension, Rhythm:Regular Rate:Normal     Neuro/Psych    GI/Hepatic   Endo/Other  diabetes  Renal/GU      Musculoskeletal   Abdominal (+) + obese,   Peds  Hematology   Anesthesia Other Findings   Reproductive/Obstetrics                           Anesthesia Physical Anesthesia Plan  ASA: III  Anesthesia Plan: General   Post-op Pain Management:    Induction: Intravenous  Airway Management Planned: LMA  Additional Equipment:   Intra-op Plan:   Post-operative Plan: Extubation in OR  Informed Consent: I have reviewed the patients History and Physical, chart, labs and discussed the procedure including the risks, benefits and alternatives for the proposed anesthesia with the patient or authorized representative who has indicated his/her understanding and acceptance.     Plan Discussed with: CRNA and Surgeon  Anesthesia Plan Comments:         Anesthesia Quick Evaluation  

## 2013-02-06 NOTE — Op Note (Signed)
Operative Note  Erica Ballard female 72 y.o. 02/06/2013  PREOPERATIVE DX:  Ductal carcinoma in situ of left breast  POSTOPERATIVE DX:  Same  PROCEDURE:  Left partial mastectomy after wire localization         Surgeon: Adolph Pollack   Assistants: Britt Bottom, PA student  Anesthesia: General LMA anesthesia  Indications: this is a 72 year old female discovered to have an abnormal area of microcalcifications in the lower outer area of the left breast. Image guided biopsy demonstrated ductal carcinoma in situ. After discussing options she is chosen lumpectomy and now presents for that after wire localization.    Procedure Detail:   She underwent successful wire localization at the breast imaging Center. She was seen in the holding area in the left breast mark my initials. She is brought to the operative the operating table given a general anesthetic was given. The left breast bandage was removed and the wire was cut close to the skin. The left breast and wire were sterilely prepped and draped.  In the inferior aspect of the breast a curvilinear incision is made through the skin and in subcutaneous tissue. I then raised subcutaneous flaps in all directions. I excised a lump of breast tissue around the wire. The tip of the wire was identified and was posterior to the lesion.. I subsequently performed a specimen mammogram of this and the area of concern was contained although it appeared close to the inferior or the superior margin. This was difficult to discerned because of the orientation of the breast tissue in the Faxitron. Subsequently I excised extra inferior and superior margins. The lumpectomy specimen was oriented with sutures.  All 3 specimens were then sent to pathology.  The wound was inspected and irrigated. Bleeding points were controlled electrocautery. The wound was anesthetized with half percent plain Marcaine for local anesthetic effect.  Once hemostasis was adequate,  subcutaneous tissue was approximated with interrupted 3-0 Vicryl sutures. The skin was closed with running 4-0 Monocryl subcuticular stitch. Steri-Strips and a sterile dressing were applied.  She tolerated the procedure without apparent complications and was taken to the recovery room in satisfactory condition.  Estimated Blood Loss:  150 ml         Drains: none  Blood Given: none          Specimens: left breast tissue.        Complications:  * No complications entered in OR log *         Disposition: PACU - hemodynamically stable.         Condition: stable

## 2013-02-06 NOTE — Transfer of Care (Signed)
Immediate Anesthesia Transfer of Care Note  Patient: Erica [facility]  Procedure(s) Performed: Procedure(s): BREAST LUMPECTOMY WITH NEEDLE LOCALIZATION (Left)  Patient Location: PACU  Anesthesia Type:General  Level of Consciousness: awake, alert  and oriented  Airway & Oxygen Therapy: Patient Spontanous Breathing and Patient connected to nasal cannula oxygen  Post-op Assessment: Report given to PACU RN and Post -op Vital signs reviewed and stable  Post vital signs: Reviewed and stable  Complications: No apparent anesthesia complications

## 2013-02-07 ENCOUNTER — Encounter (HOSPITAL_COMMUNITY): Payer: Self-pay | Admitting: General Surgery

## 2013-02-12 ENCOUNTER — Encounter: Payer: Self-pay | Admitting: *Deleted

## 2013-02-12 ENCOUNTER — Telehealth (INDEPENDENT_AMBULATORY_CARE_PROVIDER_SITE_OTHER): Payer: Self-pay

## 2013-02-12 NOTE — Telephone Encounter (Signed)
LMOV to call for pathology.  Known DCIS.  Margins clear.  Pt has post op appt on 02/20/13 with Dr. Abbey Chatters.

## 2013-02-12 NOTE — Progress Notes (Signed)
Mailed after appt letter to pt. 

## 2013-02-20 ENCOUNTER — Ambulatory Visit (INDEPENDENT_AMBULATORY_CARE_PROVIDER_SITE_OTHER): Payer: Medicare Other | Admitting: General Surgery

## 2013-02-20 ENCOUNTER — Encounter (INDEPENDENT_AMBULATORY_CARE_PROVIDER_SITE_OTHER): Payer: Self-pay | Admitting: General Surgery

## 2013-02-20 VITALS — BP 136/82 | HR 72 | Temp 98.0°F | Resp 18 | Ht 64.0 in | Wt 197.0 lb

## 2013-02-20 DIAGNOSIS — Z4889 Encounter for other specified surgical aftercare: Secondary | ICD-10-CM

## 2013-02-20 NOTE — Patient Instructions (Signed)
Resume normal activities as tolerated

## 2013-02-20 NOTE — Progress Notes (Signed)
Procedure:  Left partial mastectomy  Date:  02/06/2013  Pathology:  Low-grade DCIS.  Margins clear.  History:  She is here for her first postoperative visit and is doing well. She has minimal soreness.  Exam: General- Is in NAD. Left breast-inferior incision is clean and intact.  Assessment:  Status post left partial mastectomy for low-grade DCIS with clear margins.  Plan:  Resume normal activities. Return visit 3 months.

## 2013-02-21 ENCOUNTER — Ambulatory Visit
Admission: RE | Admit: 2013-02-21 | Discharge: 2013-02-21 | Disposition: A | Discharge status: Home / Self Care | Payer: Medicare Other | Source: Ambulatory Visit | Attending: Radiation Oncology | Admitting: Radiation Oncology

## 2013-02-21 ENCOUNTER — Ambulatory Visit: Admission: RE | Admit: 2013-02-21 | Payer: Medicare Other | Source: Ambulatory Visit

## 2013-02-21 DIAGNOSIS — Z510 Encounter for antineoplastic radiation therapy: Secondary | ICD-10-CM | POA: Insufficient documentation

## 2013-02-21 DIAGNOSIS — D0590 Unspecified type of carcinoma in situ of unspecified breast: Secondary | ICD-10-CM | POA: Insufficient documentation

## 2013-02-21 DIAGNOSIS — N644 Mastodynia: Secondary | ICD-10-CM | POA: Insufficient documentation

## 2013-02-27 ENCOUNTER — Other Ambulatory Visit (HOSPITAL_BASED_OUTPATIENT_CLINIC_OR_DEPARTMENT_OTHER): Payer: Medicare Other | Admitting: Lab

## 2013-02-27 DIAGNOSIS — C50512 Malignant neoplasm of lower-outer quadrant of left female breast: Secondary | ICD-10-CM

## 2013-02-27 DIAGNOSIS — C50519 Malignant neoplasm of lower-outer quadrant of unspecified female breast: Secondary | ICD-10-CM

## 2013-03-01 LAB — SPEP & IFE WITH QIG
Alpha-1-Globulin: 4.7 % (ref 2.9–4.9)
IgA: 335 mg/dL (ref 69–380)
IgG (Immunoglobin G), Serum: 1430 mg/dL (ref 690–1700)
IgM, Serum: 67 mg/dL (ref 52–322)
Total Protein, Serum Electrophoresis: 7.6 g/dL (ref 6.0–8.3)

## 2013-03-01 LAB — KAPPA/LAMBDA LIGHT CHAINS
Kappa free light chain: 3.03 mg/dL — ABNORMAL HIGH (ref 0.33–1.94)
Kappa:Lambda Ratio: 1.2 (ref 0.26–1.65)
Lambda Free Lght Chn: 2.52 mg/dL (ref 0.57–2.63)

## 2013-03-02 ENCOUNTER — Ambulatory Visit
Admission: RE | Admit: 2013-03-02 | Discharge: 2013-03-02 | Disposition: A | Discharge status: Home / Self Care | Payer: Medicare Other | Source: Ambulatory Visit | Attending: Radiation Oncology | Admitting: Radiation Oncology

## 2013-03-02 ENCOUNTER — Encounter: Payer: Self-pay | Admitting: Radiation Oncology

## 2013-03-02 VITALS — BP 165/86 | HR 112 | Temp 98.9°F | Wt 197.8 lb

## 2013-03-02 DIAGNOSIS — C50512 Malignant neoplasm of lower-outer quadrant of left female breast: Secondary | ICD-10-CM

## 2013-03-02 DIAGNOSIS — D0590 Unspecified type of carcinoma in situ of unspecified breast: Secondary | ICD-10-CM | POA: Insufficient documentation

## 2013-03-02 NOTE — Progress Notes (Signed)
Please see the Nurse Progress Note in the MD Initial Consult Encounter for this patient. 

## 2013-03-02 NOTE — Progress Notes (Signed)
   Department of Radiation Oncology  Phone:  [phone] Fax:        [phone]   Name: Estefanny Moler MRN: [id number]  DOB: 1940-06-09  Date: 03/02/2013  Follow Up Visit Note  Diagnosis: DCIS of the left breast  Interval History: Christl presents today for routine followup.  She did well following her surgery. She's not taking any pain medications. She has an appointment next week with medical oncology. Her lumpectomy showed low-grade ductal carcinoma in situ and atypical ductal hyperplasia within a complex sclerosing lesion. Her margins were widely negative with the closest being 0.5 cm. Her DCIS was estrogen and progesterone receptor positive. She is accompanied by her daughter today. She is somewhat hesitant to undergo radiation.  Allergies: No Known Allergies  Medications:  Current Outpatient Prescriptions  Medication Sig Dispense Refill  . amLODipine (NORVASC) 5 MG tablet Take 5 mg by mouth 2 (two) times daily.      . carvedilol (COREG) 6.25 MG tablet Take 6.25 mg by mouth 2 (two) times daily.      . hydrochlorothiazide (HYDRODIURIL) 25 MG tablet Take 25 mg by mouth daily.      Marland Kitchen HYDROcodone-acetaminophen (NORCO) 5-325 MG per tablet Take 1-2 tablets by mouth every 4 (four) hours as needed.  30 tablet  0  . JANUMET 50-500 MG per tablet Take 1 tablet by mouth 2 (two) times daily with a meal.       . Lancets 30G MISC       . spironolactone (ALDACTONE) 25 MG tablet Take 25 mg by mouth 2 (two) times daily.      Marland Kitchen PNEUMOVAX 23 25 MCG/0.5ML injection        No current facility-administered medications for this encounter.    Physical Exam:  Filed Vitals:   03/02/13 0909  BP: 165/86  Pulse: 112  Temp: 98.9 F (37.2 C)   her incision is healing well in her left breast. She is alert minus x3.  IMPRESSION: Terree is a 72 y.o. female status post lumpectomy for low-grade DCIS with negative margins  PLAN:  I spoke to Ms. Kunin and her daughter. She is very healthy for her  age. We discussed that radiation after DCIS was to prevent local failures and further surgery when she would be older and more likely have complications. We discussed was no survival benefit to radiation for DCIS. We discussed that radiation is more effective than antiestrogen therapy providing local control. I discussed with her the process of simulation the placement tattoos. We discussed 5 weeks of treatment as an outpatient. We discussed skin redness and darkening as possible side effects during treatment. We also discussed fatigue. We discussed the possible use of breath hold technique for cardiac sparing if necessary. We discussed the low rate of secondary malignancies as well as this symptomatic heart and lung damage. She agreed to proceed forward with radiation and signed informed consent. I scheduled her for simulation next week.    Lurline Hare, MD

## 2013-03-02 NOTE — Progress Notes (Signed)
         Patient Information    Patient Name Sex DOB SSN   Erica [facility], Erica [facility]ard Female 09-12-1940 [id number]            Progress Notes by Tessa Lerner, RN at 02/21/2013 11:39 AM    Author: Tessa Lerner, RN Service: (none) Author Type: Registered Nurse   Filed: 02/21/2013 11:50 AM Note Time: 02/21/2013 11:39 AM Status: Incomplete   Editor: Everton Bertha Herb Grays, RN (Registered Nurse)      Location of Breast Cancer:Left breast  Histology per Pathology Report:02/06/2013  Diagnosis  1. Breast, lumpectomy, Left  - FOCAL LOW GRADE DUCTAL CARCINOMA IN SITU AND ATYPICAL DUCTAL HYPERPLASIA  WITHIN A  COMPLEX SCLEROSING LESION, SEE COMMENT.  - RESECTION MARGINS NEGATIVE FOR CARCINOMA.  - SEE ONCOLOGY TABLE.  2. Breast, excision, Left, additional inferior margin  - BENIGN BREAST TISSUE.  - NO MALIGNANCY IDENTIFIED.  3. Breast, excision, Left, additional superior margin  - LOBULAR NEOPLASIA (ATYPICAL LOBULAR HYPERPLASIA).  - FIBROCYSTIC CHANGE.  - CALCIFICATIONS IN BENIGN BREAST TISSUE.  - NO MALIGNANCY IDENTIFIED.  Receptor Status: ER(+), PR (+), Her2-neu  Did patient present with symptoms ,found on yearly screening mammogram 12/21/12.  Past/Anticipated interventions by surgeon:Left breast lumpectomy on 02/06/13.  Left breast needle core biopsy on 01/10/13:low grade ductal ca in situ and atypical lobular hyperplasia.  Breast mri on 01/19/13 revealed abnormal enhancement in lateral lower position 3.2 cm.  Past/Anticipated interventions by medical oncology, if any: Chemotherapy not recommended, ?antiestrogen therapy after radiation.  Lymphedema issues, if any:No Pain issues, if any:No SAFETY ISSUES:  Prior radiation? No  Pacemaker/ICD?No  Possible current pregnancy?No  Is the patient on methotrexate?No Current Complaints / other details: Patient is retired widow.Has 4 children, menarche age 49, first live birth age 12, LMP 59.No HRT.  Tessa Lerner, RN  02/21/2013,11:40 AM

## 2013-03-06 ENCOUNTER — Ambulatory Visit
Admission: RE | Admit: 2013-03-06 | Discharge: 2013-03-06 | Disposition: A | Discharge status: Home / Self Care | Payer: Medicare Other | Source: Ambulatory Visit | Attending: Radiation Oncology | Admitting: Radiation Oncology

## 2013-03-06 DIAGNOSIS — C50512 Malignant neoplasm of lower-outer quadrant of left female breast: Secondary | ICD-10-CM

## 2013-03-06 NOTE — Progress Notes (Signed)
Name: Erica Ballard   MRN: [id number]  Date:  03/06/2013  DOB: 06-08-1940  Status:outpatient    DIAGNOSIS: Breast cancer.  CONSENT VERIFIED: yes   SET UP: Patient is setup supine   IMMOBILIZATION:  The following immobilization was used:Custom Moldable Pillow, breast board.   NARRATIVE: [name] was brought to the CT Simulation planning suite.  Identity was confirmed.  All relevant records and images related to the planned course of therapy were reviewed.  Then, the patient was positioned in a stable reproducible clinical set-up for radiation therapy.  Wires were placed to delineate the clinical extent of breast tissue. A wire was placed on the scar as well.  CT images were obtained.  An isocenter was placed. Skin markings were placed.  The CT images were loaded into the planning software where the target and avoidance structures were contoured.  The radiation prescription was entered and confirmed. The patient was discharged in stable condition and tolerated simulation well.    TREATMENT PLANNING NOTE:  Treatment planning then occurred. I have requested : MLC's, isodose plan, basic dose calculation  I personally designed and supervised the construction of 3 medically necessary complex treatment devices for the protection of critical normal structures including the lungs and contralateral breast as well as the immobilization device which is necessary for set up certainty.

## 2013-03-13 ENCOUNTER — Ambulatory Visit
Admission: RE | Admit: 2013-03-13 | Discharge: 2013-03-13 | Disposition: A | Discharge status: Home / Self Care | Payer: Medicare Other | Source: Ambulatory Visit | Attending: Radiation Oncology | Admitting: Radiation Oncology

## 2013-03-13 ENCOUNTER — Ambulatory Visit (HOSPITAL_BASED_OUTPATIENT_CLINIC_OR_DEPARTMENT_OTHER): Payer: Medicare Other | Admitting: Oncology

## 2013-03-13 ENCOUNTER — Ambulatory Visit: Payer: Medicare Other | Admitting: Oncology

## 2013-03-13 VITALS — BP 161/77 | HR 94 | Temp 98.3°F | Resp 18 | Ht 64.0 in | Wt 195.4 lb

## 2013-03-13 DIAGNOSIS — C50512 Malignant neoplasm of lower-outer quadrant of left female breast: Secondary | ICD-10-CM

## 2013-03-13 DIAGNOSIS — D0590 Unspecified type of carcinoma in situ of unspecified breast: Secondary | ICD-10-CM

## 2013-03-13 DIAGNOSIS — Z170 Estrogen receptor positive status [ER+]: Secondary | ICD-10-CM

## 2013-03-13 MED ORDER — ANASTROZOLE 1 MG PO TABS: 1.0000 mg | ORAL_TABLET | Freq: Every day | ORAL | Status: DC

## 2013-03-13 NOTE — Progress Notes (Signed)
Incidentally, immunoglobulin workup showed no evidence of monoclonality. Her total protein is no longer elevated.

## 2013-03-13 NOTE — Progress Notes (Signed)
IDNormajean Ballard OB: 11-20-1940  MR#: [id number]  CSN#:[id number]  PCP: Erica Pitter, MD GYN:   SU:  OTHER MD:  CHIEF COMPLAINT: "I think I breast cancer".  HISTORY OF PRESENT ILLNESS: The patient has yearly screening mammography, and on her screening mammogram at the breast center 12/21/2012 new calcifications in the left lower breast were noted. She was brought back for biopsy 01/08/2013 and the pathology ([id number]) showed a low-grade ductal carcinoma in situ which was estrogen receptor 100% positive and progesterone receptor 10% positive.  Breast MRI was obtained 01/19/2013 and showed an area of spiculated abnormal enhancement in the lateral lower position of the left breast measuring 3.2 cm maximally. There was no abnormal appearing adenopathy. Iincidentally focally increased signal in the right manubrium and medial right clavicle was noted.   The patient's subsequent history is as detailed below.  INTERVAL HISTORY: Erica Ballard returns today for followup of her noninvasive breast cancer. Since her last visit here she had her definitive surgery, which showed no invasive disease. Margins were negative.  REVIEW OF SYSTEMS: She tolerated the surgery well, with no unusual pain, fever, bleeding, or other complications. A detailed review of systems today was otherwise stable  PAST MEDICAL HISTORY: Past Medical History  Diagnosis Date  . Breast cancer   . Diabetes mellitus without complication   . Hypertension   . Dysrhythmia     palpitations  . Arthritis     PAST SURGICAL HISTORY: Past Surgical History  Procedure Laterality Date  . Abdominal hysterectomy    . Eye surgery      cataracts  . Breast lumpectomy with needle localization Left 02/06/2013    Procedure: BREAST LUMPECTOMY WITH NEEDLE LOCALIZATION;  Surgeon: Erica Pollack, MD;  Location[facility];  Service: General;  Laterality: Left;    FAMILY HISTORY Family History  Problem Relation Age of Onset  . Lung cancer  Sister    the patient has very little information about her parents. She has 3 brothers and 2 sisters. One sister died at the age of 76 from lung cancer. There is no history of breast or ovarian cancer in the family to the patient's knowledge.  GYNECOLOGIC HISTORY:  Menarche age 9, first live birth age 24. The patient is GX P4. She stopped having periods approximately 1972. She never took hormone replacement. She never took contraceptives.  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 She is widowed. Her 4 children are: Erica Ballard who works as a Medical laboratory scientific officer in [place]; Erica Ballard, who is a Acupuncturist; Erica Ballard, who is an Personnel officer; and Erica Ballard who is a Company secretary. The patient has 4 grandchildren and one great-grandchild. She attends a local [employer]  ADVANCED DIRECTIVES: Not in place; the patient is considering name and her daughter Erica Ballard as healthcare power of attorney. Or you can be reached at [phone], [phone], or [phone].   HEALTH MAINTENANCE: History  Substance Use Topics  . Smoking status: Former Smoker -- 0.25 packs/day for 2 years    Types: Cigarettes    Quit date: 02/03/2003  . Smokeless tobacco: Not on file     Comment: occ alcohol  . Alcohol Use: Yes     Colonoscopy: 2009  PAP: 2010  Bone density: 2005, at [facility] hospital; normal  Lipid panel:  No Known Allergies  Current Outpatient Prescriptions  Medication Sig Dispense Refill  . amLODipine (NORVASC) 5 MG tablet Take 5 mg by mouth 2 (two)  times daily.      . carvedilol (COREG) 6.25 MG tablet Take 6.25 mg by mouth 2 (two) times daily.      . hydrochlorothiazide (HYDRODIURIL) 25 MG tablet Take 25 mg by mouth daily.      Marland Kitchen HYDROcodone-acetaminophen (NORCO) 5-325 MG per tablet Take 1-2 tablets by mouth every 4 (four) hours as needed.  30 tablet  0  . JANUMET 50-500 MG per tablet Take 1 tablet by mouth 2 (two) times daily with a meal.        . Lancets 30G MISC       . PNEUMOVAX 23 25 MCG/0.5ML injection       . spironolactone (ALDACTONE) 25 MG tablet Take 25 mg by mouth 2 (two) times daily.       No current facility-administered medications for this visit.    OBJECTIVE: Older African American woman who appears stated age 72 Vitals:   03/13/13 1316  BP: 161/77  Pulse: 94  Temp: 98.3 F (36.8 C)  Resp: 18     Body mass index is 33.52 kg/(m^2).    ECOG FS:1 - Symptomatic but completely ambulatory  Sclerae unicteric, pupils equal and round Oropharynx clear and moist-- dentition is fair No cervical or supraclavicular adenopathy Lungs no rales or rhonchi Heart regular rate and rhythm Abd soft, nontender, positive bowel sounds MSK no focal spinal tenderness, no upper extremity lymphedema Neuro: nonfocal, well oriented, appropriate affect Breasts: The right breast is unremarkable. The left breast is status post lumpectomy. The incision has healed nicely. Cosmetic result is good. There is no evidence of disease recurrence.The left axilla is benign   LAB RESULTS:  CMP     Component Value Date/Time   NA 137 02/02/2013 1201   NA 138 01/17/2013 0814   K 4.3 02/02/2013 1201   K 3.8 01/17/2013 0814   CL 100 02/02/2013 1201   CO2 22 02/02/2013 1201   CO2 23 01/17/2013 0814   GLUCOSE 113* 02/02/2013 1201   GLUCOSE 118 01/17/2013 0814   BUN 20 02/02/2013 1201   BUN 18.1 01/17/2013 0814   CREATININE 1.17* 02/02/2013 1201   CREATININE 1.1 01/17/2013 0814   CALCIUM 10.0 02/02/2013 1201   CALCIUM 10.1 01/17/2013 0814   PROT 8.6* 01/17/2013 0814   ALBUMIN 3.8 01/17/2013 0814   AST 13 01/17/2013 0814   ALT 11 01/17/2013 0814   ALKPHOS 48 01/17/2013 0814   BILITOT 0.47 01/17/2013 0814   GFRNONAA 45* 02/02/2013 1201   GFRAA 52* 02/02/2013 1201    I No results found for this basename: SPEP,  UPEP,   kappa and lambda light chains    Lab Results  Component Value Date   WBC 6.8 02/02/2013   NEUTROABS 4.5 01/17/2013   HGB  15.0 02/02/2013   HCT 42.7 02/02/2013   MCV 91.4 02/02/2013   PLT 365 02/02/2013      Chemistry      Component Value Date/Time   NA 137 02/02/2013 1201   NA 138 01/17/2013 0814   K 4.3 02/02/2013 1201   K 3.8 01/17/2013 0814   CL 100 02/02/2013 1201   CO2 22 02/02/2013 1201   CO2 23 01/17/2013 0814   BUN 20 02/02/2013 1201   BUN 18.1 01/17/2013 0814   CREATININE 1.17* 02/02/2013 1201   CREATININE 1.1 01/17/2013 0814      Component Value Date/Time   CALCIUM 10.0 02/02/2013 1201   CALCIUM 10.1 01/17/2013 0814   ALKPHOS 48 01/17/2013 0814   AST 13  01/17/2013 0814   ALT 11 01/17/2013 0814   BILITOT 0.47 01/17/2013 0814       No results found for this basename: LABCA2    No components found with this basename: LABCA125    No results found for this basename: INR,  in the last 168 hours  Urinalysis No results found for this basename: colorurine,  appearanceur,  labspec,  phurine,  glucoseu,  hgbur,  bilirubinur,  ketonesur,  proteinur,  urobilinogen,  nitrite,  leukocytesur    STUDIES: No results found.  ASSESSMENT: 72 y.o. Erica Ballard woman status post left breast biopsy 01/08/2013 for a low-grade ductal carcinoma in situ which was 100% estrogen receptor positive, 10% progesterone receptor positive.  (1). Status post left lumpectomy 02/06/2013 showing ductal carcinoma in situ measuring 2.7 cm, grade 1, with negative margins  (2) adjuvant radiation to be completed 04/19/2012  (3) to start anastrozole at the completion of radiaiton    PLAN: Erica Ballard is now ready to start her radiation treatments. She is scheduled to complete those late January. Today we talked about anti-estrogens, and the benefits of course RA reduce risk of recurrence of this cancer, but more importantly A. reduce risk of a new breast cancer developing in either breast.  We talked about the possible toxicities, side effects and complications of anastrozole, which I think may be a better choice for her than  tamoxifen, particular since she had a normal bone density several years ago. I went ahead and wrote her the prescription.  Discuss see me after she has been on anastrozole 2 or 3 months. She tolerates it well we will obtain a baseline bone density. Otherwise we will consider either switching to tamoxifen or observation alone. The goal of treatment of course would be for 5 years on an antiestrogen.  Annia has a good understanding of this plan. She agrees with it. She knows to call for any problems that may develop before the next visit here.  Lowella Dell, MD   03/13/2013 1:38 PM

## 2013-03-13 NOTE — Progress Notes (Signed)
  Radiation Oncology         [phone]) [phone] ________________________________  Name: Erica [facility] MRN: [id number]  Date: 03/13/2013  DOB: 10-16-1940  Simulation Verification Note  Status: outpatient  NARRATIVE: The patient was brought to the treatment unit and placed in the planned treatment position. The clinical setup was verified. Then port films were obtained and uploaded to the radiation oncology medical record software.  The treatment beams were carefully compared against the planned radiation fields. The position location and shape of the radiation fields was reviewed. The targeted volume of tissue appears appropriately covered by the radiation beams. Organs at risk appear to be excluded as planned.  Based on my personal review, I approved the simulation verification. The patient's treatment will proceed as planned.  ------------------------------------------------  Lurline Hare, MD

## 2013-03-14 ENCOUNTER — Ambulatory Visit
Admission: RE | Admit: 2013-03-14 | Discharge: 2013-03-14 | Disposition: A | Discharge status: Home / Self Care | Payer: Medicare Other | Source: Ambulatory Visit | Attending: Radiation Oncology | Admitting: Radiation Oncology

## 2013-03-14 NOTE — Addendum Note (Signed)
Addended by: Monico Blitz on: 03/14/2013 01:04 PM   Modules accepted: Orders, Medications

## 2013-03-15 ENCOUNTER — Telehealth: Payer: Self-pay | Admitting: Oncology

## 2013-03-15 ENCOUNTER — Ambulatory Visit
Admission: RE | Admit: 2013-03-15 | Discharge: 2013-03-15 | Disposition: A | Discharge status: Home / Self Care | Payer: Medicare Other | Source: Ambulatory Visit | Attending: Radiation Oncology | Admitting: Radiation Oncology

## 2013-03-15 NOTE — Telephone Encounter (Signed)
, °

## 2013-03-16 ENCOUNTER — Ambulatory Visit
Admission: RE | Admit: 2013-03-16 | Discharge: 2013-03-16 | Disposition: A | Discharge status: Home / Self Care | Payer: Medicare Other | Source: Ambulatory Visit | Attending: Radiation Oncology | Admitting: Radiation Oncology

## 2013-03-16 NOTE — Addendum Note (Signed)
Encounter addended by: Dorothye Berni Mintz Calden Dorsey, RN on: 03/16/2013  3:14 PM<BR>     Documentation filed: Charges VN

## 2013-03-19 ENCOUNTER — Ambulatory Visit
Admission: RE | Admit: 2013-03-19 | Discharge: 2013-03-19 | Disposition: A | Discharge status: Home / Self Care | Payer: Medicare Other | Source: Ambulatory Visit | Attending: Radiation Oncology | Admitting: Radiation Oncology

## 2013-03-20 ENCOUNTER — Ambulatory Visit
Admission: RE | Admit: 2013-03-20 | Discharge: 2013-03-20 | Disposition: A | Discharge status: Home / Self Care | Payer: Medicare Other | Source: Ambulatory Visit | Attending: Radiation Oncology | Admitting: Radiation Oncology

## 2013-03-20 ENCOUNTER — Encounter: Payer: Self-pay | Admitting: Radiation Oncology

## 2013-03-20 VITALS — BP 142/87 | HR 71 | Temp 98.8°F | Ht 64.0 in | Wt 194.5 lb

## 2013-03-20 DIAGNOSIS — C50512 Malignant neoplasm of lower-outer quadrant of left female breast: Secondary | ICD-10-CM

## 2013-03-20 NOTE — Progress Notes (Signed)
Weekly Management Note Current Dose: 10  Gy  Projected Dose: 50 Gy   Narrative:  The patient presents for routine under treatment assessment.  CBCT/MVCT images/Port film x-rays were reviewed.  The chart was checked. Dong well. Occasional twinges of pain.   Physical Findings: Weight: 194 lb 8 oz (88.225 kg). Unchanged  Impression:  The patient is tolerating radiation.  Plan:  Continue treatment as planned. RN education performed.

## 2013-03-20 NOTE — Progress Notes (Addendum)
Ms. Streck has received 4 fractions to her left breast.  Education today regarding management of pain, skin and fatigue during treatment.  Given Radiation Therapy and You booklet.  Given Radiaplex Gel with instructions to apply BID after treatment and at bedtime. Also given Alra Deodorant.  Encouraged to ask questions if she has any concerns.  Teach back method.

## 2013-03-21 ENCOUNTER — Ambulatory Visit
Admission: RE | Admit: 2013-03-21 | Discharge: 2013-03-21 | Disposition: A | Discharge status: Home / Self Care | Payer: Medicare Other | Source: Ambulatory Visit | Attending: Radiation Oncology | Admitting: Radiation Oncology

## 2013-03-23 ENCOUNTER — Ambulatory Visit
Admission: RE | Admit: 2013-03-23 | Discharge: 2013-03-23 | Disposition: A | Discharge status: Home / Self Care | Payer: Medicare Other | Source: Ambulatory Visit | Attending: Radiation Oncology | Admitting: Radiation Oncology

## 2013-03-26 ENCOUNTER — Ambulatory Visit
Admission: RE | Admit: 2013-03-26 | Discharge: 2013-03-26 | Disposition: A | Discharge status: Home / Self Care | Payer: Medicare Other | Source: Ambulatory Visit | Attending: Radiation Oncology | Admitting: Radiation Oncology

## 2013-03-27 ENCOUNTER — Ambulatory Visit
Admission: RE | Admit: 2013-03-27 | Discharge: 2013-03-27 | Disposition: A | Discharge status: Home / Self Care | Payer: Medicare Other | Source: Ambulatory Visit | Attending: Radiation Oncology | Admitting: Radiation Oncology

## 2013-03-27 ENCOUNTER — Encounter: Payer: Self-pay | Admitting: Radiation Oncology

## 2013-03-27 VITALS — BP 177/102 | HR 95 | Resp 16 | Wt 195.7 lb

## 2013-03-27 DIAGNOSIS — C50512 Malignant neoplasm of lower-outer quadrant of left female breast: Secondary | ICD-10-CM

## 2013-03-27 NOTE — Progress Notes (Signed)
Reports using radiaplex bid as directed. Reports occasional nagging left breast pain but, understands this is related to healing. Denies skin changes within treatment field. No edema of left arm noted. Denies fatigue. Blood pressure elevated despite taking bp medication this morning. Denies headache or dizziness.

## 2013-03-27 NOTE — Progress Notes (Signed)
Weekly Management Note Current Dose:  18 Gy  Projected Dose: 50 Gy   Narrative:  The patient presents for routine under treatment assessment.  CBCT/MVCT images/Port film x-rays were reviewed.  The chart was checked. Doing well. Occasional zaps of pain but otherwise no complaints.  Physical Findings: Weight: 195 lb 11.2 oz (88.769 kg). Unchanged  Impression:  The patient is tolerating radiation.  Plan:  Continue treatment as planned. Continue radiaplex.

## 2013-03-28 ENCOUNTER — Ambulatory Visit
Admission: RE | Admit: 2013-03-28 | Discharge: 2013-03-28 | Disposition: A | Discharge status: Home / Self Care | Payer: Medicare Other | Source: Ambulatory Visit | Attending: Radiation Oncology | Admitting: Radiation Oncology

## 2013-03-30 ENCOUNTER — Ambulatory Visit
Admission: RE | Admit: 2013-03-30 | Discharge: 2013-03-30 | Disposition: A | Discharge status: Home / Self Care | Payer: Medicare Other | Source: Ambulatory Visit | Attending: Radiation Oncology | Admitting: Radiation Oncology

## 2013-04-02 ENCOUNTER — Ambulatory Visit
Admission: RE | Admit: 2013-04-02 | Discharge: 2013-04-02 | Disposition: A | Discharge status: Home / Self Care | Payer: Medicare Other | Source: Ambulatory Visit | Attending: Radiation Oncology | Admitting: Radiation Oncology

## 2013-04-03 ENCOUNTER — Ambulatory Visit
Admission: RE | Admit: 2013-04-03 | Discharge: 2013-04-03 | Disposition: A | Discharge status: Home / Self Care | Payer: Medicare Other | Source: Ambulatory Visit | Attending: Radiation Oncology | Admitting: Radiation Oncology

## 2013-04-03 ENCOUNTER — Ambulatory Visit: Admission: RE | Admit: 2013-04-03 | Payer: Medicare Other | Source: Ambulatory Visit | Admitting: Radiation Oncology

## 2013-04-03 ENCOUNTER — Encounter: Payer: Self-pay | Admitting: Radiation Oncology

## 2013-04-03 VITALS — BP 153/76 | HR 90 | Temp 98.5°F | Resp 20 | Wt 200.8 lb

## 2013-04-03 DIAGNOSIS — C50512 Malignant neoplasm of lower-outer quadrant of left female breast: Secondary | ICD-10-CM

## 2013-04-03 NOTE — Progress Notes (Signed)
Weekly rad txs left breast, 13 completed, hyperpigmentation only,skin intact, no c/o pain, using radiaplex bid, , appetite good,  4:08 PM

## 2013-04-03 NOTE — Progress Notes (Signed)
Weekly Management Note Current Dose: 26  Gy  Projected Dose: 50 Gy   Narrative:  The patient presents for routine under treatment assessment.  CBCT/MVCT images/Port film x-rays were reviewed.  The chart was checked. Doing well. Some sharp pains (occasional) through the breast.   Physical Findings: Weight: 200 lb 12.8 oz (91.082 kg). Slightly hyperpigmented left breast  Impression:  The patient is tolerating radiation.  Plan:  Continue treatment as planned. Continue radiaplex

## 2013-04-04 ENCOUNTER — Ambulatory Visit
Admission: RE | Admit: 2013-04-04 | Discharge: 2013-04-04 | Disposition: A | Discharge status: Home / Self Care | Payer: Medicare Other | Source: Ambulatory Visit | Attending: Radiation Oncology | Admitting: Radiation Oncology

## 2013-04-05 ENCOUNTER — Ambulatory Visit
Admission: RE | Admit: 2013-04-05 | Discharge: 2013-04-05 | Disposition: A | Discharge status: Home / Self Care | Payer: Medicare Other | Source: Ambulatory Visit | Attending: Radiation Oncology | Admitting: Radiation Oncology

## 2013-04-06 ENCOUNTER — Ambulatory Visit
Admission: RE | Admit: 2013-04-06 | Discharge: 2013-04-06 | Disposition: A | Discharge status: Home / Self Care | Payer: Medicare Other | Source: Ambulatory Visit | Attending: Radiation Oncology | Admitting: Radiation Oncology

## 2013-04-09 ENCOUNTER — Ambulatory Visit
Admission: RE | Admit: 2013-04-09 | Discharge: 2013-04-09 | Disposition: A | Discharge status: Home / Self Care | Payer: Medicare Other | Source: Ambulatory Visit | Attending: Radiation Oncology | Admitting: Radiation Oncology

## 2013-04-10 ENCOUNTER — Encounter: Payer: Self-pay | Admitting: Radiation Oncology

## 2013-04-10 ENCOUNTER — Ambulatory Visit
Admission: RE | Admit: 2013-04-10 | Discharge: 2013-04-10 | Disposition: A | Discharge status: Home / Self Care | Payer: Medicare Other | Source: Ambulatory Visit | Attending: Radiation Oncology | Admitting: Radiation Oncology

## 2013-04-10 VITALS — BP 178/95 | HR 85 | Wt 199.4 lb

## 2013-04-10 DIAGNOSIS — C50512 Malignant neoplasm of lower-outer quadrant of left female breast: Secondary | ICD-10-CM

## 2013-04-10 NOTE — Progress Notes (Signed)
Weekly Management Note Current Dose: 36  Gy  Projected Dose: 50 Gy   Narrative:  The patient presents for routine under treatment assessment.  CBCT/MVCT images/Port film x-rays were reviewed.  The chart was checked. Doing well. Using radiaplex.  Physical Findings: Weight: 199 lb 6.4 oz (90.447 kg). Slightly dark left breast  Impression:  The patient is tolerating radiation.  Plan:  Continue treatment as planned. Continue radiaplex.

## 2013-04-10 NOTE — Progress Notes (Signed)
Report hyperpigmentation at scar but, denies breaks in the skin. Reports using radiaplex as directed. Reports an occasional sharp shooting pain in her left breast but, understands this is related to nerves healing following surgery. Denies fatigue.  Blood pressure elevated.

## 2013-04-11 ENCOUNTER — Ambulatory Visit
Admission: RE | Admit: 2013-04-11 | Discharge: 2013-04-11 | Disposition: A | Discharge status: Home / Self Care | Payer: Medicare Other | Source: Ambulatory Visit | Attending: Radiation Oncology | Admitting: Radiation Oncology

## 2013-04-12 ENCOUNTER — Ambulatory Visit
Admission: RE | Admit: 2013-04-12 | Discharge: 2013-04-12 | Disposition: A | Discharge status: Home / Self Care | Payer: Medicare Other | Source: Ambulatory Visit | Attending: Radiation Oncology | Admitting: Radiation Oncology

## 2013-04-13 ENCOUNTER — Ambulatory Visit
Admission: RE | Admit: 2013-04-13 | Discharge: 2013-04-13 | Disposition: A | Discharge status: Home / Self Care | Payer: Medicare Other | Source: Ambulatory Visit | Attending: Radiation Oncology | Admitting: Radiation Oncology

## 2013-04-16 ENCOUNTER — Ambulatory Visit
Admission: RE | Admit: 2013-04-16 | Discharge: 2013-04-16 | Disposition: A | Discharge status: Home / Self Care | Payer: Medicare Other | Source: Ambulatory Visit | Attending: Radiation Oncology | Admitting: Radiation Oncology

## 2013-04-17 ENCOUNTER — Encounter: Payer: Self-pay | Admitting: Radiation Oncology

## 2013-04-17 ENCOUNTER — Encounter (INDEPENDENT_AMBULATORY_CARE_PROVIDER_SITE_OTHER): Payer: Self-pay | Admitting: General Surgery

## 2013-04-17 ENCOUNTER — Ambulatory Visit
Admission: RE | Admit: 2013-04-17 | Discharge: 2013-04-17 | Disposition: A | Discharge status: Home / Self Care | Payer: Medicare Other | Source: Ambulatory Visit | Attending: Radiation Oncology | Admitting: Radiation Oncology

## 2013-04-17 VITALS — BP 136/78 | HR 82 | Temp 98.7°F | Ht 64.0 in | Wt 196.3 lb

## 2013-04-17 DIAGNOSIS — C50512 Malignant neoplasm of lower-outer quadrant of left female breast: Secondary | ICD-10-CM

## 2013-04-17 MED ORDER — RADIAPLEXRX EX GEL: Freq: Once | CUTANEOUS | Status: DC

## 2013-04-17 NOTE — Progress Notes (Signed)
Weekly Management Note Current Dose: 46  Gy  Projected Dose: 50 Gy   Narrative:  The patient presents for routine under treatment assessment.  CBCT/MVCT images/Port film x-rays were reviewed.  The chart was checked. Doing well. Some skin irritation. Using radiaplex. Some fatigue.   Physical Findings: Weight: 196 lb 4.8 oz (89.041 kg). Unchanged. Dark breast.   Impression:  The patient is tolerating radiation.  Plan:  Continue treatment as planned. Finishes this week. No complaints. Discussed post treatment skin care. F/u 1 month.

## 2013-04-17 NOTE — Progress Notes (Signed)
Erica Ballard has received 23 fractions to her left breast.  She c/o tenderness of her left breast and grades this as a level 2 on a scale of 0-10.  She admits to having fatigue and taking naps. naps

## 2013-04-18 ENCOUNTER — Ambulatory Visit
Admission: RE | Admit: 2013-04-18 | Discharge: 2013-04-18 | Disposition: A | Discharge status: Home / Self Care | Payer: Medicare Other | Source: Ambulatory Visit | Attending: Radiation Oncology | Admitting: Radiation Oncology

## 2013-04-19 ENCOUNTER — Encounter: Payer: Self-pay | Admitting: Radiation Oncology

## 2013-04-19 ENCOUNTER — Ambulatory Visit
Admission: RE | Admit: 2013-04-19 | Discharge: 2013-04-19 | Disposition: A | Discharge status: Home / Self Care | Payer: Medicare Other | Source: Ambulatory Visit | Attending: Radiation Oncology | Admitting: Radiation Oncology

## 2013-04-20 NOTE — Progress Notes (Signed)
  Radiation Oncology         [phone]) [phone] ________________________________  Name: Thu Baggett MRN: [id number]  Date: 04/19/2013  DOB: January 10, 1941  End of Treatment Note  Diagnosis:   DCIS of the left breast     Indication for treatment:  Curative       Radiation treatment dates:   03/14/2013-04/19/2013  Site/dose:   Left breast / 50 Gy at 2 Gy per fraction x 25 fractions  Beams/energy:   Opposed tangents with reduced fields and 6 and 15 MV photons  Narrative: The patient tolerated radiation treatment relatively well.   She had the expected skin darkening.  Her pain and fatigue were minimal.   Plan: The patient has completed radiation treatment. The patient will return to radiation oncology clinic for routine followup in one month. I advised them to call or return sooner if they have any questions or concerns related to their recovery or treatment.  ------------------------------------------------  Thea Silversmith, MD

## 2013-04-28 ENCOUNTER — Encounter (HOSPITAL_COMMUNITY): Payer: Self-pay | Admitting: Emergency Medicine

## 2013-04-28 ENCOUNTER — Emergency Department (HOSPITAL_COMMUNITY)
Admission: EM | Admit: 2013-04-28 | Discharge: 2013-04-28 | Disposition: A | Discharge status: Home / Self Care | Payer: Medicare Other | Source: Home / Self Care | Attending: Family Medicine | Admitting: Family Medicine

## 2013-04-28 DIAGNOSIS — S0100XA Unspecified open wound of scalp, initial encounter: Secondary | ICD-10-CM

## 2013-04-28 DIAGNOSIS — S0101XA Laceration without foreign body of scalp, initial encounter: Secondary | ICD-10-CM

## 2013-04-28 NOTE — Discharge Instructions (Signed)
Thank you for coming in today.  Return in 1 week to remove the staple.  Keep the wound clean.  Keep it covered with ointment to keep a scab from forming.  Go to the emergency room if you develope a bad headache or you have weakness or numbness or uncontrolled vomiting.

## 2013-04-28 NOTE — ED Provider Notes (Signed)
Erica Ballard is a 73 y.o. female who presents to Urgent Care today for scalp laceration. Patient had a dish fell out of the cabinet onto her head today. She suffered a small laceration to her scalp. She denies any loss of consciousness headache pain weakness numbness trouble with functioning. Her daughter states that she is acting normally. She feels well. She was able to control bleeding with compression.   Past Medical History  Diagnosis Date  . Breast cancer   . Diabetes mellitus without complication   . Hypertension   . Dysrhythmia     palpitations  . Arthritis    History  Substance Use Topics  . Smoking status: Former Smoker -- 0.25 packs/day for 2 years    Types: Cigarettes    Quit date: 02/03/2003  . Smokeless tobacco: Not on file     Comment: occ alcohol  . Alcohol Use: Yes   ROS as above Medications: No current facility-administered medications for this encounter.   Current Outpatient Prescriptions  Medication Sig Dispense Refill  . amLODipine (NORVASC) 5 MG tablet Take 5 mg by mouth 2 (two) times daily.      Marland Kitchen anastrozole (ARIMIDEX) 1 MG tablet Take 1 tablet (1 mg total) by mouth daily.  90 tablet  4  . carvedilol (COREG) 6.25 MG tablet Take 6.25 mg by mouth 2 (two) times daily.      . hydrochlorothiazide (HYDRODIURIL) 25 MG tablet Take 25 mg by mouth daily.      Marland Kitchen HYDROcodone-acetaminophen (NORCO) 5-325 MG per tablet Take 1-2 tablets by mouth every 4 (four) hours as needed.  30 tablet  0  . JANUMET 50-500 MG per tablet Take 1 tablet by mouth 2 (two) times daily with a meal.       . Lancets 30G MISC       . non-metallic deodorant (ALRA) MISC Apply 1 application topically daily as needed.      Marland Kitchen PNEUMOVAX 23 25 MCG/0.5ML injection       . spironolactone (ALDACTONE) 25 MG tablet Take 25 mg by mouth 2 (two) times daily.      . Wound Cleansers (RADIAPLEX EX) Apply topically.        Exam:  BP 175/101  Pulse 98  Temp(Src) 98.4 F (36.9 C) (Oral)  Resp 18  SpO2  96% Gen: Well NAD HEENT: EOMI,  MMM. Skin; 1 cm scalp laceration extending through the dermis. Located on the crown of the head. Lungs: Normal work of breathing. CTABL Heart: RRR no MRG Abd: NABS, Soft. NT, ND Exts: Brisk capillary refill, warm and well perfused.  Neuro: Alert and oriented normal balance gait and coordination  Laceration repair:  2 mL of lidocaine with epinephrine was injected around the wound. The wound was copiously irrigated with sterile saline. One staple was used to close the wound. Patient tolerated the procedure well  Assessment and Plan: 73 y.o. female with scalp laceration. Repaired with staple. Return in one week for staple removal. No sign of intracranial injury.   Discussed warning signs or symptoms. Please see discharge instructions. Patient expresses understanding.    Gregor Hams, MD 04/28/13 [phone]

## 2013-04-28 NOTE — ED Notes (Signed)
Pt reports laceration to top of head onset 45 minutes ago Reports she was getting out something from the fridge when a ceramic plate fell on top of her head Bleeding is controlled... Denies LOC Alert w/no signs of acute distress.

## 2013-05-05 ENCOUNTER — Emergency Department (HOSPITAL_COMMUNITY)
Admission: EM | Admit: 2013-05-05 | Discharge: 2013-05-05 | Disposition: A | Discharge status: Home / Self Care | Payer: Medicare Other | Source: Home / Self Care | Attending: Family Medicine | Admitting: Family Medicine

## 2013-05-05 ENCOUNTER — Encounter (HOSPITAL_COMMUNITY): Payer: Self-pay | Admitting: Emergency Medicine

## 2013-05-05 DIAGNOSIS — Z4802 Encounter for removal of sutures: Secondary | ICD-10-CM

## 2013-05-05 NOTE — Discharge Instructions (Signed)
Thank you for coming in today. No restrictions.   Suture Removal, Care After Refer to this sheet in the next few weeks. These instructions provide you with information on caring for yourself after your procedure. Your health care provider may also give you more specific instructions. Your treatment has been planned according to current medical practices, but problems sometimes occur. Call your health care provider if you have any problems or questions after your procedure. WHAT TO EXPECT AFTER THE PROCEDURE After your stitches (sutures) are removed, it is typical to have the following:  Some discomfort and swelling in the wound area.  Slight redness in the area. HOME CARE INSTRUCTIONS   If you have skin adhesive strips over the wound area, do not take the strips off. They will fall off on their own in a few days. If the strips remain in place after 14 days, you may remove them.  Change any bandages (dressings) at least once a day or as directed by your health care provider. If the bandage sticks, soak it off with warm, soapy water.  Apply cream or ointment only as directed by your health care provider. If using cream or ointment, wash the area with soap and water 2 times a day to remove all the cream or ointment. Rinse off the soap and pat the area dry with a clean towel.  Keep the wound area dry and clean. If the bandage becomes wet or dirty, or if it develops a bad smell, change it as soon as possible.  Continue to protect the wound from injury.  Use sunscreen when out in the sun. New scars become sunburned easily. SEEK MEDICAL CARE IF:  You have increasing redness, swelling, or pain in the wound.  You see pus coming from the wound.  You have a fever.  You notice a bad smell coming from the wound or dressing.  Your wound breaks open (edges not staying together). Document Released: 12/08/2000 Document Revised: 01/03/2013 Document Reviewed: 10/25/2012 [facility] Patient Information  2014 [place].

## 2013-05-05 NOTE — ED Provider Notes (Signed)
Erica Ballard is a 73 y.o. female who presents to Urgent Care today for scalp laceration followup. Patient was seen one week ago for scalp laceration. This was treated with one staple. She feels well currently and is here for staple removal.   Past Medical History  Diagnosis Date  . Breast cancer   . Diabetes mellitus without complication   . Hypertension   . Dysrhythmia     palpitations  . Arthritis    History  Substance Use Topics  . Smoking status: Former Smoker -- 0.25 packs/day for 2 years    Types: Cigarettes    Quit date: 02/03/2003  . Smokeless tobacco: Not on file     Comment: occ alcohol  . Alcohol Use: Yes   ROS as above Medications: No current facility-administered medications for this encounter.   Current Outpatient Prescriptions  Medication Sig Dispense Refill  . amLODipine (NORVASC) 5 MG tablet Take 5 mg by mouth 2 (two) times daily.      Marland Kitchen anastrozole (ARIMIDEX) 1 MG tablet Take 1 tablet (1 mg total) by mouth daily.  90 tablet  4  . carvedilol (COREG) 6.25 MG tablet Take 6.25 mg by mouth 2 (two) times daily.      . hydrochlorothiazide (HYDRODIURIL) 25 MG tablet Take 25 mg by mouth daily.      Marland Kitchen HYDROcodone-acetaminophen (NORCO) 5-325 MG per tablet Take 1-2 tablets by mouth every 4 (four) hours as needed.  30 tablet  0  . JANUMET 50-500 MG per tablet Take 1 tablet by mouth 2 (two) times daily with a meal.       . Lancets 30G MISC       . non-metallic deodorant (ALRA) MISC Apply 1 application topically daily as needed.      Marland Kitchen PNEUMOVAX 23 25 MCG/0.5ML injection       . spironolactone (ALDACTONE) 25 MG tablet Take 25 mg by mouth 2 (two) times daily.      . Wound Cleansers (RADIAPLEX EX) Apply topically.        Exam:  BP 187/94  Pulse 101  Temp(Src) 98.7 F (37.1 C) (Oral)  Resp 20  SpO2 97% Gen: Well NAD Scalp : Well appearing laceration with one index table. This was removed with no bleeding.    Assessment and Plan: 73 y.o. female with scalp  laceration status post staple removal. Doing well no restrictions followup as needed.  Discussed warning signs or symptoms. Please see discharge instructions. Patient expresses understanding.    Gregor Hams, MD 05/05/13 0930

## 2013-05-05 NOTE — ED Notes (Signed)
Pt is here for a f/u of head laceration and to remove staples Voices no new concerns. Alert w/no signs of acute distress.

## 2013-05-31 ENCOUNTER — Ambulatory Visit
Admission: RE | Admit: 2013-05-31 | Discharge: 2013-05-31 | Disposition: A | Discharge status: Home / Self Care | Payer: Medicare Other | Source: Ambulatory Visit | Attending: Radiation Oncology | Admitting: Radiation Oncology

## 2013-05-31 VITALS — BP 163/77 | HR 92 | Temp 98.5°F | Wt 198.4 lb

## 2013-05-31 DIAGNOSIS — C50512 Malignant neoplasm of lower-outer quadrant of left female breast: Secondary | ICD-10-CM

## 2013-05-31 NOTE — Progress Notes (Signed)
   Department of Radiation Oncology  Phone:  [phone] Fax:        [phone]   Name: Erica Ballard MRN: [id number]  DOB: 06-11-1940  Date: 05/31/2013  Follow Up Visit Note  Diagnosis: DCIS of theleft breast  Summary and Interval since last radiation: 50 Gy completed 04/19/13  Interval History: Erica Ballard presents today for routine followup.  She is feeling well and doing well. She is still using Radiaplex. She unfortunately had to have stitches after a platter fell on her head. She has a little bit of breast tenderness. She is taking a RAM effects and tolerating that well.  Allergies: No Known Allergies  Medications:  Current Outpatient Prescriptions  Medication Sig Dispense Refill  . amLODipine (NORVASC) 5 MG tablet Take 5 mg by mouth 2 (two) times daily.      Marland Kitchen anastrozole (ARIMIDEX) 1 MG tablet Take 1 tablet (1 mg total) by mouth daily.  90 tablet  4  . carvedilol (COREG) 6.25 MG tablet Take 6.25 mg by mouth 2 (two) times daily.      . hydrochlorothiazide (HYDRODIURIL) 25 MG tablet Take 25 mg by mouth daily.      Marland Kitchen HYDROcodone-acetaminophen (NORCO) 5-325 MG per tablet Take 1-2 tablets by mouth every 4 (four) hours as needed.  30 tablet  0  . JANUMET 50-500 MG per tablet Take 1 tablet by mouth 2 (two) times daily with a meal.       . Lancets 30G MISC       . non-metallic deodorant (ALRA) MISC Apply 1 application topically daily as needed.      Marland Kitchen spironolactone (ALDACTONE) 25 MG tablet Take 25 mg by mouth 2 (two) times daily.      . Wound Cleansers (RADIAPLEX EX) Apply topically.      Marland Kitchen PNEUMOVAX 23 25 MCG/0.5ML injection        No current facility-administered medications for this encounter.    Physical Exam:  Filed Vitals:   05/31/13 1421  BP: 163/77  Pulse: 92  Temp: 98.5 F (36.9 C)  Weight: 198 lb 6.4 oz (89.994 kg)   she has hyperpigmentation over the left breast particularly laterally.  IMPRESSION: Erica Ballard is a 73 y.o. female status post breast  conservation with resolving acute effects of treatment.  PLAN:  She is feeling well. She has really a good cosmetic result. I encouraged her to start using lotion with vitamin E. I have not scheduled followup with me. She has regularly scheduled followup with medical oncology and surgery. I encouraged her to undergo annual mammograms. And asked her to call me if she had any questions in the future.    Thea Silversmith, MD

## 2013-05-31 NOTE — Progress Notes (Signed)
Patient here for routine one month follow up completion of radiation to left breast on 04/19/13.Has some breast tenderness.Skin hyperpigmented with dry peel.Has still been using radiaplex .Told patient she may start application of lotion with vitamin E. Went to urgent care on 05/05/13 for scalp laceration which required one staple.Taking arimidex daily.

## 2013-07-16 ENCOUNTER — Encounter: Payer: Self-pay | Admitting: Physician Assistant

## 2013-07-16 ENCOUNTER — Ambulatory Visit (HOSPITAL_BASED_OUTPATIENT_CLINIC_OR_DEPARTMENT_OTHER): Payer: Medicare Other

## 2013-07-16 ENCOUNTER — Telehealth: Payer: Self-pay | Admitting: *Deleted

## 2013-07-16 ENCOUNTER — Ambulatory Visit (HOSPITAL_BASED_OUTPATIENT_CLINIC_OR_DEPARTMENT_OTHER): Payer: Medicare Other | Admitting: Physician Assistant

## 2013-07-16 ENCOUNTER — Telehealth: Payer: Self-pay | Admitting: Oncology

## 2013-07-16 VITALS — BP 153/91 | HR 90 | Temp 98.8°F | Resp 18 | Ht 64.0 in | Wt 196.7 lb

## 2013-07-16 DIAGNOSIS — D0590 Unspecified type of carcinoma in situ of unspecified breast: Secondary | ICD-10-CM

## 2013-07-16 DIAGNOSIS — C50512 Malignant neoplasm of lower-outer quadrant of left female breast: Secondary | ICD-10-CM

## 2013-07-16 DIAGNOSIS — Z780 Asymptomatic menopausal state: Secondary | ICD-10-CM

## 2013-07-16 DIAGNOSIS — Z170 Estrogen receptor positive status [ER+]: Secondary | ICD-10-CM

## 2013-07-16 DIAGNOSIS — Z853 Personal history of malignant neoplasm of breast: Secondary | ICD-10-CM

## 2013-07-16 LAB — CBC WITH DIFFERENTIAL/PLATELET
BASO%: 0.8 % (ref 0.0–2.0)
Basophils Absolute: 0 10*3/uL (ref 0.0–0.1)
EOS%: 2.1 % (ref 0.0–7.0)
Eosinophils Absolute: 0.1 10*3/uL (ref 0.0–0.5)
HEMATOCRIT: 38.1 % (ref 34.8–46.6)
HGB: 12.7 g/dL (ref 11.6–15.9)
LYMPH#: 0.7 10*3/uL — AB (ref 0.9–3.3)
LYMPH%: 12.7 % — ABNORMAL LOW (ref 14.0–49.7)
MCH: 30.7 pg (ref 25.1–34.0)
MCHC: 33.3 g/dL (ref 31.5–36.0)
MCV: 92.3 fL (ref 79.5–101.0)
MONO#: 0.7 10*3/uL (ref 0.1–0.9)
MONO%: 12.6 % (ref 0.0–14.0)
NEUT%: 71.8 % (ref 38.4–76.8)
NEUTROS ABS: 3.7 10*3/uL (ref 1.5–6.5)
PLATELETS: 357 10*3/uL (ref 145–400)
RBC: 4.13 10*6/uL (ref 3.70–5.45)
RDW: 14.1 % (ref 11.2–14.5)
WBC: 5.2 10*3/uL (ref 3.9–10.3)

## 2013-07-16 LAB — COMPREHENSIVE METABOLIC PANEL (CC13)
ALT: 10 U/L (ref 0–55)
ANION GAP: 9 meq/L (ref 3–11)
AST: 15 U/L (ref 5–34)
Albumin: 3.7 g/dL (ref 3.5–5.0)
Alkaline Phosphatase: 47 U/L (ref 40–150)
BUN: 20.5 mg/dL (ref 7.0–26.0)
CALCIUM: 9.6 mg/dL (ref 8.4–10.4)
CO2: 25 meq/L (ref 22–29)
CREATININE: 1.1 mg/dL (ref 0.6–1.1)
Chloride: 109 mEq/L (ref 98–109)
GLUCOSE: 97 mg/dL (ref 70–140)
Potassium: 4.1 mEq/L (ref 3.5–5.1)
SODIUM: 143 meq/L (ref 136–145)
TOTAL PROTEIN: 7.5 g/dL (ref 6.4–8.3)
Total Bilirubin: 0.25 mg/dL (ref 0.20–1.20)

## 2013-07-16 MED ORDER — ANASTROZOLE 1 MG PO TABS: 1.0000 mg | ORAL_TABLET | Freq: Every day | ORAL | Status: DC

## 2013-07-16 NOTE — Progress Notes (Signed)
ID: Erica Ballard OB: 07-Jan-1941  MR#: [id number]  KYH#:[id number]  PCP: Elyn Peers, MD GYN:   SU:  Jackolyn Confer, MD OTHER MD: Thea Silversmith, MD  CHIEF COMPLAINT:  Hx of Left Breast Cancer/On anastrazole   HISTORY OF PRESENT ILLNESS: The patient has yearly screening mammography, and on her screening mammogram at the breast center 12/21/2012 new calcifications in the left lower breast were noted. She was brought back for biopsy 01/08/2013 and the pathology ([id number]) showed a low-grade ductal carcinoma in situ which was estrogen receptor 100% positive and progesterone receptor 10% positive.  Breast MRI was obtained 01/19/2013 and showed an area of spiculated abnormal enhancement in the lateral lower position of the left breast measuring 3.2 cm maximally. There was no abnormal appearing adenopathy. Iincidentally focally increased signal in the right manubrium and medial right clavicle was noted.   The patient's subsequent history is as detailed below.  INTERVAL HISTORY: Erica Ballard returns alone today for followup of her noninvasive left breast cancer. Since her appointment here in mid December, she completed her radiation therapy on 04/19/2013 under the care of Dr. Pablo Ledger. She has recovered well from radiation with no complications. She began on anastrozole, 1 mg daily, in late January.   She is tolerating the anastrozole very well. She has only occasional, mild hot flashes, but nothing she would consider problematic. She's had no increase in joint pain. She does have some postsurgical pain in the left breast which is mild.   REVIEW OF SYSTEMS: Erica Ballard  denies any recent illnesses and has had no fevers or chills. Her energy level is good, as is her appetite. She's had no problems with nausea or emesis and denies any change in bowel or bladder habits. She denies any abnormal bleeding, and specifically she has had no vaginal bleeding. She denies any increased vaginal dryness. She's  had no cough, phlegm production, increased shortness of breath, chest pain, or palpitations. She denies any abnormal headaches or dizziness. She also denies any unusual myalgias, arthralgias, or bony pain and has had no peripheral swelling.   A detailed review of systems today was otherwise stable  PAST MEDICAL HISTORY: Past Medical History  Diagnosis Date  . Breast cancer   . Diabetes mellitus without complication   . Hypertension   . Dysrhythmia     palpitations  . Arthritis   . Radiation 03/14/13-04/19/13    Left breast 25 fractions    PAST SURGICAL HISTORY: Past Surgical History  Procedure Laterality Date  . Abdominal hysterectomy    . Eye surgery      cataracts  . Breast lumpectomy with needle localization Left 02/06/2013    Procedure: BREAST LUMPECTOMY WITH NEEDLE LOCALIZATION;  Surgeon: Odis Hollingshead, MD;  Location: [place];  Service: General;  Laterality: Left;    FAMILY HISTORY Family History  Problem Relation Age of Onset  . Lung cancer Sister    the patient has very little information about her parents. She has 3 brothers and 2 sisters. One sister died at the age of 83 from lung cancer. There is no history of breast or ovarian cancer in the family to the patient's knowledge.  GYNECOLOGIC HISTORY:     (updated 07/16/2013) Menarche age 74, first live birth age 17. The patient is GX P4. She stopped having periods approximately 1972. She never took hormone replacement. She never took contraceptives.  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 She is  widowed. Her 4 children are: [facility] who works as a Engineer, site in [place]; Lin Givens, who is a Chartered certified accountant; Clide Cliff, who is an Clinical biochemist; and Jeniah Kishi who is a Biochemist, clinical. The patient has 4 grandchildren and one great-grandchild. She attends a local [place]: Not in place; the patient is  considering name and her daughter Peter Congo as healthcare power of attorney. Or you can be reached at [phone], [phone], or [phone].   HEALTH MAINTENANCE:  (updated 07/16/2013) History  Substance Use Topics  . Smoking status: Former Smoker -- 0.25 packs/day for 2 years    Types: Cigarettes    Quit date: 02/03/2003  . Smokeless tobacco: Not on file     Comment: occ alcohol  . Alcohol Use: Yes     Colonoscopy: 2009  PAP: 2010  Bone density: [facility], at [facility]; normal  Lipid panel: Not on file/Dr. Criss Rosales   No Known Allergies  Current Outpatient Prescriptions  Medication Sig Dispense Refill  . amLODipine (NORVASC) 5 MG tablet Take 5 mg by mouth 2 (two) times daily.      Marland Kitchen anastrozole (ARIMIDEX) 1 MG tablet Take 1 tablet (1 mg total) by mouth daily.  90 tablet  3  . carvedilol (COREG) 6.25 MG tablet Take 6.25 mg by mouth 2 (two) times daily.      . hydrochlorothiazide (HYDRODIURIL) 25 MG tablet Take 25 mg by mouth daily.      Marland Kitchen JANUMET 50-500 MG per tablet Take 1 tablet by mouth 2 (two) times daily with a meal.       . ketoconazole (NIZORAL) 2 % shampoo       . Lancets 30G MISC       . non-metallic deodorant (ALRA) MISC Apply 1 application topically daily as needed.      Marland Kitchen spironolactone (ALDACTONE) 25 MG tablet Take 25 mg by mouth 2 (two) times daily.      Marland Kitchen HYDROcodone-acetaminophen (NORCO) 5-325 MG per tablet Take 1-2 tablets by mouth every 4 (four) hours as needed.  30 tablet  0  . PNEUMOVAX 23 25 MCG/0.5ML injection       . Wound Cleansers (RADIAPLEX EX) Apply topically.       No current facility-administered medications for this visit.    OBJECTIVE: Older African American woman who appears  comfortable and is in no acute distress  Filed Vitals:   07/16/13 1320  BP: 153/91  Pulse: 90  Temp: 98.8 F (37.1 C)  Resp: 18     Body mass index is 33.75 kg/(m^2).    ECOG FS: 0 Filed Weights   07/16/13 1320  Weight: 196 lb 11.2 oz (89.223 kg)   Physical Exam: HEENT:   Sclerae anicteric.  Oropharynx clear, pink, and moist. Neck supple, trachea midline.  NODES:  No cervical or supraclavicular lymphadenopathy palpated.  BREAST EXAM:  Right breast is unremarkable. Left breast is status Ballard lumpectomy and radiation therapy. There is some hyperpigmentation status Ballard radiation, but no additional skin changes are noted. Incision is well-healed with no suspicious nodularities and no evidence of local recurrence. Axillae are benign bilaterally, no palpable lymphadenopathy. LUNGS:  Clear to auscultation bilaterally with good excursion.  No wheezes or rhonchi HEART:  Regular rate and rhythm. No murmur  ABDOMEN:  Soft, nontender.  Positive bowel sounds.  MSK:  No focal spinal tenderness to palpation. Good range of motion bilaterally in the upper extremities. EXTREMITIES:  No peripheral edema.  No lymphedema in the  left upper extremity. SKIN:  Benign with no visible rashes. No excessive ecchymoses. No petechiae. No pallor. Good skin turgor. NEURO:  Nonfocal. Well oriented.  Appropriate affect.    LAB RESULTS:  Lab Results  Component Value Date   WBC 5.2 07/16/2013   NEUTROABS 3.7 07/16/2013   HGB 12.7 07/16/2013   HCT 38.1 07/16/2013   MCV 92.3 07/16/2013   PLT 357 07/16/2013      Chemistry      Component Value Date/Time   NA 143 07/16/2013 1417   NA 137 02/02/2013 1201   K 4.1 07/16/2013 1417   K 4.3 02/02/2013 1201   CL 100 02/02/2013 1201   CO2 25 07/16/2013 1417   CO2 22 02/02/2013 1201   BUN 20.5 07/16/2013 1417   BUN 20 02/02/2013 1201   CREATININE 1.1 07/16/2013 1417   CREATININE 1.17* 02/02/2013 1201      Component Value Date/Time   CALCIUM 9.6 07/16/2013 1417   CALCIUM 10.0 02/02/2013 1201   ALKPHOS 47 07/16/2013 1417   AST 15 07/16/2013 1417   ALT 10 07/16/2013 1417   BILITOT 0.25 07/16/2013 1417       STUDIES:  Patient will be due for both bone density and diagnostic mammogram in September 2015.     ASSESSMENT: 73 y.o. [place] woman status  Ballard left breast biopsy 01/08/2013 for a low-grade ductal carcinoma in situ which was 100% estrogen receptor positive, 10% progesterone receptor positive.  (1). Status Ballard left lumpectomy 02/06/2013 showing ductal carcinoma in situ measuring 2.7 cm, grade 1, with negative margins  (2) adjuvant radiation, completed 04/19/2013  (3) started anastrozole in late January 2015, the plan being to continue for total of 5 years (until January 2020)   PLAN: [place] is  doing well with regards to her breast cancer, with no clinical evidence of disease recurrence at this time. She's tolerating the anastrozole well, and we'll continue as planned. The plan being to continue for total of 5 years.   We will see her back in 6 months, and prior to that appointment in October 2015, we will obtain both a bone density and repeat mammogram in late September. In the meanwhile, she knows to call with any changes or problems that might occur.    Erica Burrow, PA-C   07/16/2013 3:00 PM

## 2013-07-16 NOTE — Telephone Encounter (Signed)
Called pt x 2 to inform her of Lab results.( all WNL) No answer. Left a detailed message concerning labs. Also left message with pt's husband and call her mobile #. If pt has any questions she can call this LPN @ [phone]. Message to be forwarded to [employer], PA-C.

## 2013-07-16 NOTE — Telephone Encounter (Signed)
gv pt appt schedule for oct - mammo @ [facility] 9/28 and bone density @ [facility] 9/28. per [facility] previous bone density test done @ [facility].

## 2013-07-30 ENCOUNTER — Encounter (INDEPENDENT_AMBULATORY_CARE_PROVIDER_SITE_OTHER): Payer: Self-pay | Admitting: General Surgery

## 2013-08-30 ENCOUNTER — Ambulatory Visit (INDEPENDENT_AMBULATORY_CARE_PROVIDER_SITE_OTHER): Payer: Medicare Other | Admitting: General Surgery

## 2013-09-17 ENCOUNTER — Ambulatory Visit (INDEPENDENT_AMBULATORY_CARE_PROVIDER_SITE_OTHER): Payer: Medicare Other | Admitting: General Surgery

## 2013-09-17 VITALS — BP 132/82 | HR 87 | Temp 98.0°F | Resp 18

## 2013-09-17 DIAGNOSIS — C50912 Malignant neoplasm of unspecified site of left female breast: Secondary | ICD-10-CM

## 2013-09-17 DIAGNOSIS — C50919 Malignant neoplasm of unspecified site of unspecified female breast: Secondary | ICD-10-CM

## 2013-09-17 NOTE — Patient Instructions (Signed)
Call if you find any new lumps in your breasts or chest wall. 

## 2013-09-17 NOTE — Progress Notes (Signed)
Procedure:  Left partial mastectomy  Date:  02/06/2013  Pathology:  Low-grade DCIS.  Margins clear.  History:  She is here for a long-term followup visit. She has completed her radiation therapy. She has been started on Arimidex and is tolerating this well. Exam: General- Is in NAD. Left breast-inferior scar is present, no dominant masses, no radiation skin changes.  Right breast-no dominant masses or suspicious skin changes.  Lymph nodes-no palpable cervical, supraclavicular, or axillary adenopathy.  Assessment:  Status post left partial mastectomy and XRT for low-grade DCIS with clear margins-No clinical evidence of recurrence. Due to see Dr. Jana Hakim in approximately 3-4 months.  Plan:  Return visit in 7 months.

## 2013-10-01 ENCOUNTER — Telehealth (INDEPENDENT_AMBULATORY_CARE_PROVIDER_SITE_OTHER): Payer: Self-pay

## 2013-10-01 NOTE — Telephone Encounter (Signed)
LMOM to call nursing triage. Pt walked into the office to get a refill on her pain meds. Pt was unable to rate her pain for the front desk, stating that her pain was off and on. Will need to get further info so that we can send this to Dr Zella Richer.

## 2013-12-24 ENCOUNTER — Ambulatory Visit
Admission: RE | Admit: 2013-12-24 | Discharge: 2013-12-24 | Disposition: A | Discharge status: Home / Self Care | Payer: Medicare Other | Source: Ambulatory Visit | Attending: Physician Assistant | Admitting: Physician Assistant

## 2013-12-24 ENCOUNTER — Ambulatory Visit (HOSPITAL_COMMUNITY)
Admission: RE | Admit: 2013-12-24 | Discharge: 2013-12-24 | Disposition: A | Discharge status: Home / Self Care | Payer: Medicare Other | Source: Ambulatory Visit | Attending: Physician Assistant | Admitting: Physician Assistant

## 2013-12-24 DIAGNOSIS — Z780 Asymptomatic menopausal state: Secondary | ICD-10-CM | POA: Diagnosis not present

## 2013-12-24 DIAGNOSIS — Z853 Personal history of malignant neoplasm of breast: Secondary | ICD-10-CM

## 2013-12-24 DIAGNOSIS — Z13820 Encounter for screening for osteoporosis: Secondary | ICD-10-CM | POA: Insufficient documentation

## 2014-01-14 ENCOUNTER — Other Ambulatory Visit (HOSPITAL_BASED_OUTPATIENT_CLINIC_OR_DEPARTMENT_OTHER): Payer: Medicare Other

## 2014-01-14 ENCOUNTER — Other Ambulatory Visit: Payer: Self-pay | Admitting: *Deleted

## 2014-01-14 ENCOUNTER — Telehealth: Payer: Self-pay | Admitting: Oncology

## 2014-01-14 ENCOUNTER — Ambulatory Visit (HOSPITAL_BASED_OUTPATIENT_CLINIC_OR_DEPARTMENT_OTHER): Payer: Medicare Other | Admitting: Oncology

## 2014-01-14 VITALS — BP 162/82 | HR 74 | Temp 98.6°F | Resp 18 | Ht 64.0 in | Wt 192.3 lb

## 2014-01-14 DIAGNOSIS — Z853 Personal history of malignant neoplasm of breast: Secondary | ICD-10-CM

## 2014-01-14 DIAGNOSIS — C50512 Malignant neoplasm of lower-outer quadrant of left female breast: Secondary | ICD-10-CM

## 2014-01-14 DIAGNOSIS — Z79818 Long term (current) use of other agents affecting estrogen receptors and estrogen levels: Secondary | ICD-10-CM

## 2014-01-14 LAB — COMPREHENSIVE METABOLIC PANEL (CC13)
ALT: 11 U/L (ref 0–55)
ANION GAP: 9 meq/L (ref 3–11)
AST: 14 U/L (ref 5–34)
Albumin: 3.6 g/dL (ref 3.5–5.0)
Alkaline Phosphatase: 51 U/L (ref 40–150)
BILIRUBIN TOTAL: 0.42 mg/dL (ref 0.20–1.20)
BUN: 20.2 mg/dL (ref 7.0–26.0)
CO2: 25 mEq/L (ref 22–29)
Calcium: 10 mg/dL (ref 8.4–10.4)
Chloride: 106 mEq/L (ref 98–109)
Creatinine: 1.1 mg/dL (ref 0.6–1.1)
GLUCOSE: 118 mg/dL (ref 70–140)
Potassium: 4.1 mEq/L (ref 3.5–5.1)
Sodium: 139 mEq/L (ref 136–145)
Total Protein: 7.5 g/dL (ref 6.4–8.3)

## 2014-01-14 LAB — CBC WITH DIFFERENTIAL/PLATELET
BASO%: 0.6 % (ref 0.0–2.0)
Basophils Absolute: 0 10*3/uL (ref 0.0–0.1)
EOS%: 2.9 % (ref 0.0–7.0)
Eosinophils Absolute: 0.2 10*3/uL (ref 0.0–0.5)
HCT: 39.1 % (ref 34.8–46.6)
HGB: 12.7 g/dL (ref 11.6–15.9)
LYMPH%: 15.4 % (ref 14.0–49.7)
MCH: 30.3 pg (ref 25.1–34.0)
MCHC: 32.5 g/dL (ref 31.5–36.0)
MCV: 93.3 fL (ref 79.5–101.0)
MONO#: 0.6 10*3/uL (ref 0.1–0.9)
MONO%: 12.1 % (ref 0.0–14.0)
NEUT#: 3.6 10*3/uL (ref 1.5–6.5)
NEUT%: 69 % (ref 38.4–76.8)
Platelets: 370 10*3/uL (ref 145–400)
RBC: 4.19 10*6/uL (ref 3.70–5.45)
RDW: 13.5 % (ref 11.2–14.5)
WBC: 5.2 10*3/uL (ref 3.9–10.3)
lymph#: 0.8 10*3/uL — ABNORMAL LOW (ref 0.9–3.3)

## 2014-01-14 NOTE — Telephone Encounter (Signed)
per pof to sch pt appt-gave pt copy of sch °

## 2014-01-14 NOTE — Progress Notes (Signed)
ID: [name] OB: 06/27/1940  MR#: [id number]  PYP#:[id number]  PCP: Elyn Peers, MD GYN:   SU:  Jackolyn Confer, MD OTHER MD: Thea Silversmith, MD  CHIEF COMPLAINT: Estrogen receptor positive breast cancer   CURRENT TREATMENT: Anastrozole   HISTORY OF PRESENT ILLNESS: From the original indignant:  The patient has yearly screening mammography, and on her screening mammogram at the breast center 12/21/2012 new calcifications in the left lower breast were noted. 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  Breast MRI was obtained 01/19/2013 and showed an area of spiculated abnormal enhancement in the lateral lower position of the left breast measuring 3.2 cm maximally. There was no abnormal appearing adenopathy. Iincidentally focally increased signal in the right manubrium and medial right clavicle was noted.   The patient's subsequent history is as detailed below.  INTERVAL HISTORY: Loria returns alone today for followup of her noninvasive left breast cancer. the interval history is generally unremarkable. She is tolerating anastrozole with no significant side effects and in particular no arthralgias or myalgias. She is visiting a lot of her relatives who are in a rest home certain nursing homes because of strokes and heart attacks. Her on but pressure and diabetes though, she tells me, are well-controlled. She appears about $6 a month for the medication, which is very favorable   REVIEW OF SYSTEMS: Syra  denies unusual headaches, visual changes, cough, phlegm production, pleurisy, shortness of breath, or change in bowel or bladder habits. She has not noted any change in either of her breasts. A detailed review of systems today was noncontributory   PAST MEDICAL HISTORY: Past Medical History  Diagnosis Date  . Breast  cancer   . Diabetes mellitus without complication   . Hypertension   . Dysrhythmia     palpitations  . Arthritis   . Radiation 03/14/13-04/19/13    Left breast 25 fractions    PAST SURGICAL HISTORY: Past Surgical History  Procedure Laterality Date  . Abdominal hysterectomy    . Eye surgery      cataracts  . Breast lumpectomy with needle localization Left 02/06/2013    Procedure: BREAST LUMPECTOMY WITH NEEDLE LOCALIZATION;  Surgeon: Odis Hollingshead, MD;  Location: [facility];  Service: General;  Laterality: Left;    FAMILY HISTORY Family History  Problem Relation Age of Onset  . Lung cancer Sister    the patient has very little information about her parents. She has 3 brothers and 2 sisters. One sister died at the age of 48 from lung cancer. There is no history of breast or ovarian cancer in the family to the patient's knowledge.  GYNECOLOGIC HISTORY:     (updated 07/16/2013) Menarche age 73, first live birth age 64. The patient is GX P4. She stopped having periods approximately 1972. She never took hormone replacement. She never took contraceptives.  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 Her 4 children are: [facility] who works as a Engineer, site in [place]; Lin Givens, who is a Chartered certified accountant; Clide Cliff, who is an Clinical biochemist; and Zola Runion who is a Biochemist, clinical. The patient has 4 grandchildren and one great-grandchild. She attends a local [employer]: Not in place; the patient is considering name and her daughter Peter Congo as healthcare  power of attorney. Or you can be reached at [phone], [phone], or [phone].   HEALTH MAINTENANCE:  (updated 07/16/2013) History  Substance Use Topics  . Smoking status: Former Smoker -- 0.25 packs/day for 2 years    Types: Cigarettes    Quit date: 02/03/2003  . Smokeless tobacco: Not on file     Comment: occ alcohol   . Alcohol Use: Yes     Colonoscopy: 2009  PAP: 2010  Bone density: 2005, at [facility] hospital; normal  Lipid panel: Not on file/Dr. Criss Rosales   No Known Allergies  Current Outpatient Prescriptions  Medication Sig Dispense Refill  . amLODipine (NORVASC) 5 MG tablet Take 5 mg by mouth 2 (two) times daily.      Marland Kitchen anastrozole (ARIMIDEX) 1 MG tablet Take 1 tablet (1 mg total) by mouth daily.  90 tablet  3  . carvedilol (COREG) 6.25 MG tablet Take 6.25 mg by mouth 2 (two) times daily.      . hydrochlorothiazide (HYDRODIURIL) 25 MG tablet Take 25 mg by mouth daily.      Marland Kitchen HYDROcodone-acetaminophen (NORCO) 5-325 MG per tablet Take 1-2 tablets by mouth every 4 (four) hours as needed.  30 tablet  0  . JANUMET 50-500 MG per tablet Take 1 tablet by mouth 2 (two) times daily with a meal.       . ketoconazole (NIZORAL) 2 % shampoo       . Lancets 30G MISC       . non-metallic deodorant (ALRA) MISC Apply 1 application topically daily as needed.      Marland Kitchen PNEUMOVAX 23 25 MCG/0.5ML injection       . spironolactone (ALDACTONE) 25 MG tablet Take 25 mg by mouth 2 (two) times daily.      . Wound Cleansers (RADIAPLEX EX) Apply topically.       No current facility-administered medications for this visit.    OBJECTIVE:  middle-aged  African American woman  in no acute distress  Filed Vitals:   01/14/14 1340  BP: 162/82  Pulse: 74  Temp: 98.6 F (37 C)  Resp: 18     Body mass index is 32.99 kg/(m^2).    ECOG FS: 0 Filed Weights   01/14/14 1340  Weight: 192 lb 4.8 oz (87.227 kg)   Sclerae unicteric, EOMs intact Oropharynx clear, teeth in fair repair No cervical or supraclavicular adenopathy Lungs no rales or rhonchi Heart regular rate and rhythm Abd soft, obese, nontender, positive bowel sounds MSK no focal spinal tenderness, no upper extremity lymphedema Neuro: nonfocal, well oriented, appropriate affect Breasts: The right breast is unremarkable. The left breast is status post lumpectomy and  radiation. There is no evidence of local recurrence. Left axilla is benign.     LAB RESULTS:  Lab Results  Component Value Date   WBC 5.2 01/14/2014   NEUTROABS 3.6 01/14/2014   HGB 12.7 01/14/2014   HCT 39.1 01/14/2014   MCV 93.3 01/14/2014   PLT 370 01/14/2014      Chemistry      Component Value Date/Time   NA 143 07/16/2013 1417   NA 137 02/02/2013 1201   K 4.1 07/16/2013 1417   K 4.3 02/02/2013 1201   CL 100 02/02/2013 1201   CO2 25 07/16/2013 1417   CO2 22 02/02/2013 1201   BUN 20.5 07/16/2013 1417   BUN 20 02/02/2013 1201   CREATININE 1.1 07/16/2013 1417   CREATININE 1.17* 02/02/2013 1201      Component Value Date/Time  CALCIUM 9.6 07/16/2013 1417   CALCIUM 10.0 02/02/2013 1201   ALKPHOS 47 07/16/2013 1417   AST 15 07/16/2013 1417   ALT 10 07/16/2013 1417   BILITOT 0.25 07/16/2013 1417       STUDIES: CLINICAL DATA: Patient is a currently asymptomatic 73 year old  female with a personal history of left breast cancer status post  conservation therapy in 2014 including lumpectomy and radiation.  Patient reports she has she is currently taking anastrozole.  EXAM:  DIGITAL DIAGNOSTIC BILATERAL MAMMOGRAM WITH CAD  COMPARISON: 12/13/2007 through 01/08/2013 mammogram  ACR Breast Density Category b: There are scattered areas of  fibroglandular density.  FINDINGS:  Digital bilateral diagnostic mammography demonstrates scattered  fibroglandular densities. Interval post surgical changes related to  lumpectomy are identified in the posterior upper outer left breast.  Spot-compression magnification view of the lumpectomy bed reveals  expected postsurgical sequela and effacement of normal  fibroglandular tissue. Vascular arterial wall calcification is  identified. No new mass or suspicious microcalcification.  Mammographic images were processed with CAD.  IMPRESSION:  Post lumpectomy sequelae of the left breast without mammographic  findings of malignancy.  RECOMMENDATION:   Diagnostic mammogram is suggested in 1 year. (Code:DM-B-01Y)  I have discussed the findings and recommendations with the patient.  Results were also provided in writing at the conclusion of the  visit. If applicable, a reminder letter will be sent to the patient  regarding the next appointment.  BI-RADS CATEGORY 2: Benign Finding(s)  Electronically Signed  By: Andres Shad  On: 12/24/2013 11:20    ASSESSMENT: 73 y.o.  woman status post left breast biopsy 01/08/2013 for a low-grade ductal carcinoma in situ which was 100% estrogen receptor positive, 10% progesterone receptor positive.  (1). Status post left lumpectomy 02/06/2013 showing ductal carcinoma in situ measuring 2.7 cm, grade 1, with negative margins  (2) adjuvant radiation, completed 04/19/2013  (3) started anastrozole in late January 2015; bone density at [facility] hospital 12/24/2013 was normal   PLAN: Liese is tolerating the anastrozole well. The plan will be to complete a total of 5 years. Remain concerned with that drug of course is bone thinning but luckily her bone density is normal.  She's got to see [place] again in 6 months. She knows to call for any problems that may develop before that visit.  Chauncey Cruel, MD   01/14/2014 1:48 PM

## 2014-05-24 ENCOUNTER — Other Ambulatory Visit: Payer: Self-pay | Admitting: Nurse Practitioner

## 2014-06-04 ENCOUNTER — Telehealth: Payer: Self-pay | Admitting: Oncology

## 2014-06-04 NOTE — Telephone Encounter (Signed)
per GM to move to HB-GM on call-cld & left [pt a message of updated time & date

## 2014-06-18 DIAGNOSIS — M150 Primary generalized (osteo)arthritis: Secondary | ICD-10-CM | POA: Diagnosis not present

## 2014-06-18 DIAGNOSIS — I10 Essential (primary) hypertension: Secondary | ICD-10-CM | POA: Diagnosis not present

## 2014-06-18 DIAGNOSIS — E0800 Diabetes mellitus due to underlying condition with hyperosmolarity without nonketotic hyperglycemic-hyperosmolar coma (NKHHC): Secondary | ICD-10-CM | POA: Diagnosis not present

## 2014-06-18 DIAGNOSIS — E780 Pure hypercholesterolemia: Secondary | ICD-10-CM | POA: Diagnosis not present

## 2014-06-18 DIAGNOSIS — C50111 Malignant neoplasm of central portion of right female breast: Secondary | ICD-10-CM | POA: Diagnosis not present

## 2014-07-02 ENCOUNTER — Encounter: Payer: Self-pay | Admitting: General Surgery

## 2014-07-02 DIAGNOSIS — D0512 Intraductal carcinoma in situ of left breast: Secondary | ICD-10-CM | POA: Diagnosis not present

## 2014-07-02 NOTE — Progress Notes (Signed)
Patient ID: Erica Ballard, female   DOB: [birth year], 74 y.o.   MRN: [id number] Erica Ballard 07/02/2014 4:44 PM Location: [facility] Patient #: [id number] DOB: 10/17/1940 Widowed / Language: Cleophus Molt / Race: Black or African American Female History of Present Illness Erica Hollingshead MD; 07/02/2014 5:50 PM) Patient words: breast follow up.  The patient is a 74 year old female    Note:Procedure: Left partial mastectomy  Date: 02/06/2013  Pathology: Low-grade DCIS. Margins clear.  History: She is here for a long-term followup visit. She denies any new breast masses. She is still on Arimidex. Mammogram from 12/24/13 was BIRADS 2. Exam: General- Is in NAD.  Left breast-inferior firm scar is present, no dominant mass.  Right breast-no dominant masses or suspicious skin changes.  Lymph nodes-no palpable cervical, supraclavicular, or axillary adenopathy.     Other Problems Elbert Ewings, CMA; 07/02/2014 4:44 PM) Breast Cancer Diabetes Mellitus High blood pressure Hypercholesterolemia  Diagnostic Studies History Elbert Ewings, CMA; 07/02/2014 4:44 PM) Colonoscopy 5-10 years ago  Allergies Elbert Ewings, CMA; 07/02/2014 4:44 PM) No Known Drug Allergies 07/02/2014  Medication History Elbert Ewings, CMA; 07/02/2014 4:49 PM) AmLODIPine Besylate (5MG  Tablet, Oral) Active. Arimidex (1MG  Tablet, Oral) Active. Coreg (6.25MG  Tablet, Oral) Active. Hydrochlorothiazide (25MG  Tablet, Oral) Active. Janumet (50-500MG  Tablet, Oral) Active. Nizoral (2% Shampoo, External) Active. Lancets 30G Active. Pneumovax 23 (25MCG/0.5ML Injectable, Injection) Active. Aldactone (25MG  Tablet, Oral) Active. RadiaPlex (External) Active. Medications Reconciled  Social History Elbert Ewings, [place]; 07/02/2014 4:44 PM) Tobacco use Never smoker.  Pregnancy / Birth History Elbert Ewings, CMA; 07/02/2014 4:44 PM) Age at menarche 43 years. Irregular periods Para 4     Review  of Systems Elbert Ewings CMA; 07/02/2014 4:44 PM) General Not Present- Appetite Loss, Chills, Fatigue, Fever, Night Sweats, Weight Gain and Weight Loss. Skin Not Present- Change in Wart/Mole, Dryness, Hives, Jaundice, New Lesions, Non-Healing Wounds, Rash and Ulcer. HEENT Not Present- Earache, Hearing Loss, Hoarseness, Nose Bleed, Oral Ulcers, Ringing in the Ears, Seasonal Allergies, Sinus Pain, Sore Throat, Visual Disturbances, Wears glasses/contact lenses and Yellow Eyes. Breast Not Present- Breast Mass, Breast Pain, Nipple Discharge and Skin Changes. Gastrointestinal Not Present- Abdominal Pain, Bloating, Bloody Stool, Change in Bowel Habits, Chronic diarrhea, Constipation, Difficulty Swallowing, Excessive gas, Gets full quickly at meals, Hemorrhoids, Indigestion, Nausea, Rectal Pain and Vomiting. Female Genitourinary Not Present- Frequency, Nocturia, Painful Urination, Pelvic Pain and Urgency. Neurological Not Present- Decreased Memory, Fainting, Headaches, Numbness, Seizures, Tingling, Tremor, Trouble walking and Weakness. Psychiatric Not Present- Anxiety, Bipolar, Change in Sleep Pattern, Depression, Fearful and Frequent crying.  Vitals Elbert Ewings CMA; 07/02/2014 4:49 PM) 07/02/2014 4:49 PM Weight: 195 lb Height: 64in Body Surface Area: 2 m Body Mass Index: 33.47 kg/m Temp.: 98.87F(Oral)  Pulse: 86 (Regular)  Resp.: 16 (Unlabored)  BP: 128/78 (Sitting, Left Arm, Standard)     Assessment & Plan Erica Hollingshead MD; 07/02/2014 5:50 PM)  DCIS (DUCTAL CARCINOMA IN SITU), LEFT (233.0  D05.12) Impression: Assessment: Status Ballard left partial mastectomy and XRT for low-grade DCIS with clear margins-No clinical evidence of recurrence. On Arimidex.  Plan: Return visit in 6 months  Current Plans Follow up in 6 months or as needed Free Text Instructions : discussed with patient and provided information.  Jackolyn Confer, MD

## 2014-07-16 ENCOUNTER — Other Ambulatory Visit: Payer: Self-pay | Admitting: *Deleted

## 2014-07-16 ENCOUNTER — Other Ambulatory Visit: Payer: Self-pay

## 2014-07-16 ENCOUNTER — Ambulatory Visit: Payer: Self-pay | Admitting: Nurse Practitioner

## 2014-07-16 ENCOUNTER — Other Ambulatory Visit: Payer: Medicare Other

## 2014-07-16 ENCOUNTER — Ambulatory Visit: Payer: Medicare Other | Admitting: Oncology

## 2014-07-16 DIAGNOSIS — C50512 Malignant neoplasm of lower-outer quadrant of left female breast: Secondary | ICD-10-CM

## 2014-07-19 ENCOUNTER — Other Ambulatory Visit: Payer: Self-pay | Admitting: *Deleted

## 2014-07-19 DIAGNOSIS — C50512 Malignant neoplasm of lower-outer quadrant of left female breast: Secondary | ICD-10-CM

## 2014-07-19 MED ORDER — ANASTROZOLE 1 MG PO TABS: 1.0000 mg | ORAL_TABLET | Freq: Every day | ORAL | Status: DC

## 2014-10-04 ENCOUNTER — Other Ambulatory Visit: Payer: Self-pay | Admitting: *Deleted

## 2014-10-04 ENCOUNTER — Telehealth: Payer: Self-pay | Admitting: Oncology

## 2014-10-04 DIAGNOSIS — C50512 Malignant neoplasm of lower-outer quadrant of left female breast: Secondary | ICD-10-CM

## 2014-10-04 MED ORDER — ANASTROZOLE 1 MG PO TABS: 1.0000 mg | ORAL_TABLET | Freq: Every day | ORAL | Status: DC

## 2014-10-04 NOTE — Telephone Encounter (Signed)
Left message to confirm appointment for 07/26,. Mailed calnedar.

## 2014-10-16 DIAGNOSIS — M130 Polyarthritis, unspecified: Secondary | ICD-10-CM | POA: Diagnosis not present

## 2014-10-16 DIAGNOSIS — Z0000 Encounter for general adult medical examination without abnormal findings: Secondary | ICD-10-CM | POA: Diagnosis not present

## 2014-10-16 DIAGNOSIS — E0800 Diabetes mellitus due to underlying condition with hyperosmolarity without nonketotic hyperglycemic-hyperosmolar coma (NKHHC): Secondary | ICD-10-CM | POA: Diagnosis not present

## 2014-10-16 DIAGNOSIS — E782 Mixed hyperlipidemia: Secondary | ICD-10-CM | POA: Diagnosis not present

## 2014-10-16 DIAGNOSIS — I10 Essential (primary) hypertension: Secondary | ICD-10-CM | POA: Diagnosis not present

## 2014-10-22 ENCOUNTER — Encounter: Payer: Self-pay | Admitting: Nurse Practitioner

## 2014-10-22 ENCOUNTER — Other Ambulatory Visit (HOSPITAL_BASED_OUTPATIENT_CLINIC_OR_DEPARTMENT_OTHER): Payer: Medicare Other

## 2014-10-22 ENCOUNTER — Telehealth: Payer: Self-pay | Admitting: Nurse Practitioner

## 2014-10-22 ENCOUNTER — Ambulatory Visit (HOSPITAL_BASED_OUTPATIENT_CLINIC_OR_DEPARTMENT_OTHER): Payer: Medicare Other | Admitting: Nurse Practitioner

## 2014-10-22 VITALS — BP 146/76 | HR 90 | Temp 98.6°F | Resp 18 | Ht 64.0 in | Wt 191.5 lb

## 2014-10-22 DIAGNOSIS — C50512 Malignant neoplasm of lower-outer quadrant of left female breast: Secondary | ICD-10-CM

## 2014-10-22 DIAGNOSIS — Z86000 Personal history of in-situ neoplasm of breast: Secondary | ICD-10-CM

## 2014-10-22 LAB — CBC WITH DIFFERENTIAL/PLATELET
BASO%: 0.4 % (ref 0.0–2.0)
Basophils Absolute: 0 10*3/uL (ref 0.0–0.1)
EOS%: 1.7 % (ref 0.0–7.0)
Eosinophils Absolute: 0.1 10*3/uL (ref 0.0–0.5)
HEMATOCRIT: 37.8 % (ref 34.8–46.6)
HGB: 12.6 g/dL (ref 11.6–15.9)
LYMPH%: 19.9 % (ref 14.0–49.7)
MCH: 30.3 pg (ref 25.1–34.0)
MCHC: 33.3 g/dL (ref 31.5–36.0)
MCV: 90.9 fL (ref 79.5–101.0)
MONO#: 0.5 10*3/uL (ref 0.1–0.9)
MONO%: 11 % (ref 0.0–14.0)
NEUT#: 3.2 10*3/uL (ref 1.5–6.5)
NEUT%: 67 % (ref 38.4–76.8)
Platelets: 330 10*3/uL (ref 145–400)
RBC: 4.16 10*6/uL (ref 3.70–5.45)
RDW: 13.5 % (ref 11.2–14.5)
WBC: 4.8 10*3/uL (ref 3.9–10.3)
lymph#: 1 10*3/uL (ref 0.9–3.3)

## 2014-10-22 LAB — COMPREHENSIVE METABOLIC PANEL (CC13)
ALBUMIN: 3.8 g/dL (ref 3.5–5.0)
ALT: 11 U/L (ref 0–55)
AST: 15 U/L (ref 5–34)
Alkaline Phosphatase: 49 U/L (ref 40–150)
Anion Gap: 8 mEq/L (ref 3–11)
BILIRUBIN TOTAL: 0.35 mg/dL (ref 0.20–1.20)
BUN: 22.5 mg/dL (ref 7.0–26.0)
CHLORIDE: 106 meq/L (ref 98–109)
CO2: 26 mEq/L (ref 22–29)
Calcium: 9.7 mg/dL (ref 8.4–10.4)
Creatinine: 0.9 mg/dL (ref 0.6–1.1)
EGFR: 71 mL/min/{1.73_m2} — ABNORMAL LOW (ref >90)
GLUCOSE: 104 mg/dL (ref 70–140)
Potassium: 3.8 mEq/L (ref 3.5–5.1)
SODIUM: 141 meq/L (ref 136–145)
Total Protein: 7.6 g/dL (ref 6.4–8.3)

## 2014-10-22 NOTE — Progress Notes (Signed)
Erica Ballard OB: 1941/01/12  MR#: [id number]  SNK#:[id number]  PCP: Elyn Peers, MD GYN:   SU:  Jackolyn Confer, MD OTHER MD: Thea Silversmith, MD  CHIEF COMPLAINT: Estrogen receptor positive breast cancer   CURRENT TREATMENT: Anastrozole  BREAST CANCER HISTORY: From the original indignant:  The patient has yearly screening mammography, and on her screening mammogram at the breast center 12/21/2012 new calcifications in the left lower breast were noted. 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  Breast MRI was obtained 01/19/2013 and showed an area of spiculated abnormal enhancement in the lateral lower position of the left breast measuring 3.2 cm maximally. There was no abnormal appearing adenopathy. Iincidentally focally increased signal in the right manubrium and medial right clavicle was noted.   The patient's subsequent history is as detailed below.  INTERVAL HISTORY: Erica Ballard returns alone today for follow up of her noninvasive left breast cancer. She has been on anastrozole since January 2015 and is tolerating this drug reasonably well. She has rare hot flashes and denies vaginal changes or arthralgias/myalgias. The interval history is generally unremarkable. Her children got her an exercise bike that she uses regularly to "keep loose."  REVIEW OF SYSTEMS: Marguetta denies fevers, chills, nausea, vomiting, or changes in bowel or bladder habits. She denies shortness of breath, chest pain, cough, or palpitations. She has no headaches, dizziness, unexplained weight loss, fatigue, or weakness. Her bilateral ankles swell by the end of the day. Otherwise a detailed review of systems is otherwise stable.  PAST MEDICAL HISTORY: Past Medical History  Diagnosis Date  . Breast cancer   . Diabetes mellitus without complication    . Hypertension   . Dysrhythmia     palpitations  . Arthritis   . Radiation 03/14/13-04/19/13    Left breast 25 fractions    PAST SURGICAL HISTORY: Past Surgical History  Procedure Laterality Date  . Abdominal hysterectomy    . Eye surgery      cataracts  . Breast lumpectomy with needle localization Left 02/06/2013    Procedure: BREAST LUMPECTOMY WITH NEEDLE LOCALIZATION;  Surgeon: Odis Hollingshead, MD;  Location: [facility];  Service: General;  Laterality: Left;    FAMILY HISTORY Family History  Problem Relation Age of Onset  . Lung cancer Sister    the patient has very little information about her parents. She has 3 brothers and 2 sisters. One sister died at the age of 61 from lung cancer. There is no history of breast or ovarian cancer in the family to the patient's knowledge.  GYNECOLOGIC HISTORY:     (updated 07/16/2013) Menarche age 1, first live birth age 59. The patient is GX P4. She stopped having periods approximately 1972. She never took hormone replacement. She never took contraceptives.  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 Her 4 children are: [facility] who works as a Engineer, site in [place]; Lin Givens, who is a Chartered certified accountant; Clide Cliff, who is an Clinical biochemist; and Breyanna Valera who is a Biochemist, clinical. The patient has 4 grandchildren and one great-grandchild. She attends a local [employer]: Not in place; the patient is considering name and her daughter Peter Congo as healthcare power of attorney. Or you can be reached at [phone], [phone], or [phone].  HEALTH MAINTENANCE:  (updated 07/16/2013) History  Substance Use Topics  . Smoking status: Former Smoker -- 0.25 packs/day for 2 years    Types: Cigarettes    Quit date: 02/03/2003  . Smokeless tobacco: Not on file     Comment: occ alcohol  . Alcohol Use: Yes     Colonoscopy: 2009  PAP:  2010  Bone density: 2005, at [facility] hospital; normal  Lipid panel: Not on file/Dr. Criss Rosales   No Known Allergies  Current Outpatient Prescriptions  Medication Sig Dispense Refill  . amLODipine (NORVASC) 5 MG tablet Take 5 mg by mouth 2 (two) times daily.    Marland Kitchen anastrozole (ARIMIDEX) 1 MG tablet Take 1 tablet (1 mg total) by mouth daily. 90 tablet 0  . carvedilol (COREG) 6.25 MG tablet Take 6.25 mg by mouth 2 (two) times daily.    . hydrochlorothiazide (HYDRODIURIL) 25 MG tablet Take 25 mg by mouth daily.    Marland Kitchen JANUMET 50-500 MG per tablet Take 1 tablet by mouth 2 (two) times daily with a meal.     . Lancets 30G MISC     . metFORMIN (GLUCOPHAGE) 1000 MG tablet TAKE 1 TABLET BY MOUTH EVERY DAY FOR DIABETES  1  . non-metallic deodorant (ALRA) MISC Apply 1 application topically daily as needed.    Glory Rosebush VERIO test strip USE AS DIRECTED DAILY TESTING  1  . spironolactone (ALDACTONE) 25 MG tablet Take 25 mg by mouth 2 (two) times daily.    Marland Kitchen HYDROcodone-acetaminophen (NORCO) 5-325 MG per tablet Take 1-2 tablets by mouth every 4 (four) hours as needed. (Patient not taking: Reported on 10/22/2014) 30 tablet 0  . ketoconazole (NIZORAL) 2 % shampoo     . PNEUMOVAX 23 25 MCG/0.5ML injection      No current facility-administered medications for this visit.    OBJECTIVE:  middle-aged  African American woman  in no acute distress  Filed Vitals:   10/22/14 1351  BP: 146/76  Pulse: 90  Temp: 98.6 F (37 C)  Resp: 18     Body mass index is 32.85 kg/(m^2).    ECOG FS: 0 Filed Weights   10/22/14 1351  Weight: 191 lb 8 oz (86.864 kg)   Skin: warm, dry  HEENT: sclerae anicteric, conjunctivae pink, oropharynx clear. No thrush or mucositis.  Lymph Nodes: No cervical or supraclavicular lymphadenopathy  Lungs: clear to auscultation bilaterally, no rales, wheezes, or rhonci  Heart: regular rate and rhythm  Abdomen: round, soft, non tender, positive bowel sounds  Musculoskeletal: No focal spinal  tenderness, +2 bilateral ankle edema Neuro: non focal, well oriented, positive affect  Breasts: left breast status post lumpectomy and radiation. No evidence of recurrent disease. Left axilla benign. Right breast unremarkable.   LAB RESULTS:  Lab Results  Component Value Date   WBC 4.8 10/22/2014   NEUTROABS 3.2 10/22/2014   HGB 12.6 10/22/2014   HCT 37.8 10/22/2014   MCV 90.9 10/22/2014   PLT 330 10/22/2014      Chemistry      Component Value Date/Time   NA 141 10/22/2014 1337   NA 137 02/02/2013 1201   K 3.8 10/22/2014 1337   K 4.3 02/02/2013 1201   CL 100 02/02/2013 1201   CO2 26 10/22/2014 1337   CO2 22 02/02/2013 1201   BUN 22.5 10/22/2014 1337   BUN 20 02/02/2013 1201   CREATININE 0.9 10/22/2014 1337   CREATININE 1.17* 02/02/2013 1201      Component Value Date/Time   CALCIUM  9.7 10/22/2014 1337   CALCIUM 10.0 02/02/2013 1201   ALKPHOS 49 10/22/2014 1337   AST 15 10/22/2014 1337   ALT 11 10/22/2014 1337   BILITOT 0.35 10/22/2014 1337       STUDIES: No results found.  ASSESSMENT: 74 y.o. [place] woman status post left breast biopsy 01/08/2013 for a low-grade ductal carcinoma in situ which was 100% estrogen receptor positive, 10% progesterone receptor positive.  (1). Status post left lumpectomy 02/06/2013 showing ductal carcinoma in situ measuring 2.7 cm, grade 1, with negative margins  (2) adjuvant radiation, completed 04/19/2013  (3) started anastrozole in late January 2015; bone density at [facility] hospital 12/24/2013 was normal   PLAN: Sukanya looks and feels great today. The labs were reviewed in detail and were entirely stable. She is tolerating the anastrozole well and will continue this drug for 5 years of antiestrogen therapy.   She knows her next mammogram will be due in September.   Lisl will return in 6 months for labs and a follow up visit. If all is well at this appointment we will move her to yearly visits. She understands and agrees  with this plan. She knows the goal of treatment in her case is cure. She has been encouraged to call with any issues that might arise before her next visit here.  Laurie Panda, NP   10/22/2014 3:35 PM

## 2014-10-22 NOTE — Telephone Encounter (Signed)
Appointments made for 17month appointment per [facility] as the other pof was an error

## 2014-11-25 ENCOUNTER — Other Ambulatory Visit: Payer: Self-pay | Admitting: Family Medicine

## 2014-11-25 ENCOUNTER — Other Ambulatory Visit: Payer: Self-pay

## 2014-11-25 DIAGNOSIS — Z853 Personal history of malignant neoplasm of breast: Secondary | ICD-10-CM

## 2015-01-01 ENCOUNTER — Ambulatory Visit
Admission: RE | Admit: 2015-01-01 | Discharge: 2015-01-01 | Disposition: A | Discharge status: Home / Self Care | Payer: Medicare Other | Source: Ambulatory Visit | Attending: Family Medicine | Admitting: Family Medicine

## 2015-01-01 ENCOUNTER — Inpatient Hospital Stay: Admission: RE | Admit: 2015-01-01 | Payer: Medicare Other | Source: Ambulatory Visit

## 2015-01-01 ENCOUNTER — Other Ambulatory Visit: Payer: Medicare Other

## 2015-01-01 DIAGNOSIS — Z853 Personal history of malignant neoplasm of breast: Secondary | ICD-10-CM

## 2015-01-01 DIAGNOSIS — R928 Other abnormal and inconclusive findings on diagnostic imaging of breast: Secondary | ICD-10-CM | POA: Diagnosis not present

## 2015-01-02 ENCOUNTER — Other Ambulatory Visit: Payer: Self-pay | Admitting: General Surgery

## 2015-01-02 DIAGNOSIS — D0512 Intraductal carcinoma in situ of left breast: Secondary | ICD-10-CM | POA: Diagnosis not present

## 2015-01-02 NOTE — Addendum Note (Signed)
Addended by: Odis Hollingshead on: 01/02/2015 12:25 PM   Modules accepted: Orders

## 2015-02-13 DIAGNOSIS — E0801 Diabetes mellitus due to underlying condition with hyperosmolarity with coma: Secondary | ICD-10-CM | POA: Diagnosis not present

## 2015-02-13 DIAGNOSIS — M130 Polyarthritis, unspecified: Secondary | ICD-10-CM | POA: Diagnosis not present

## 2015-02-13 DIAGNOSIS — Z6833 Body mass index (BMI) 33.0-33.9, adult: Secondary | ICD-10-CM | POA: Diagnosis not present

## 2015-02-13 DIAGNOSIS — I10 Essential (primary) hypertension: Secondary | ICD-10-CM | POA: Diagnosis not present

## 2015-02-13 DIAGNOSIS — E782 Mixed hyperlipidemia: Secondary | ICD-10-CM | POA: Diagnosis not present

## 2015-02-13 DIAGNOSIS — E089 Diabetes mellitus due to underlying condition without complications: Secondary | ICD-10-CM | POA: Diagnosis not present

## 2015-03-05 DIAGNOSIS — Z7984 Long term (current) use of oral hypoglycemic drugs: Secondary | ICD-10-CM | POA: Diagnosis not present

## 2015-03-05 DIAGNOSIS — H40011 Open angle with borderline findings, low risk, right eye: Secondary | ICD-10-CM | POA: Diagnosis not present

## 2015-03-05 DIAGNOSIS — E119 Type 2 diabetes mellitus without complications: Secondary | ICD-10-CM | POA: Diagnosis not present

## 2015-03-05 DIAGNOSIS — H3589 Other specified retinal disorders: Secondary | ICD-10-CM | POA: Diagnosis not present

## 2015-03-05 DIAGNOSIS — Z961 Presence of intraocular lens: Secondary | ICD-10-CM | POA: Diagnosis not present

## 2015-03-23 IMAGING — MG MM LT BREAST BX W LOC DEV 1ST LESION IMAGE BX SPEC STEREO GUIDE
3 series · 3 of 3 positions shown · non-contrast
Comparison: Previous exams.

ADDENDUM:
I talked with the patient today by phone. She states she is doing
well since her stereotactic left breast biopsy without problems at
the biopsy site. I gave her results of her biopsy which was
compatible with low grade DCIS with ALH. The pathology results are
concordant with the imaging findings. The patient's immediate
questions and concerns were addressed. She was given an appointment
to the [facility] 01/17/2013 and an appointment for breast MRI 01/19/2013
at [DATE] a.m.. The she was notified that a representative from the
[facility] would be contacting her regarding further information for her
appointment.
CLINICAL DATA: The patient presents for stereotactic core needle
biopsy of a group of indeterminate microcalcifications over the over
lower left breast. Previous benign left breast stereotactic needle
biopsy [id number].

EXAM:
STEREOTACTIC CORE NEEDLE BIOPSY; DIGITAL DIAGNOSTIC UNILATERAL LEFT
MAMMOGRAM LIMITED

[L SPECIMEN]
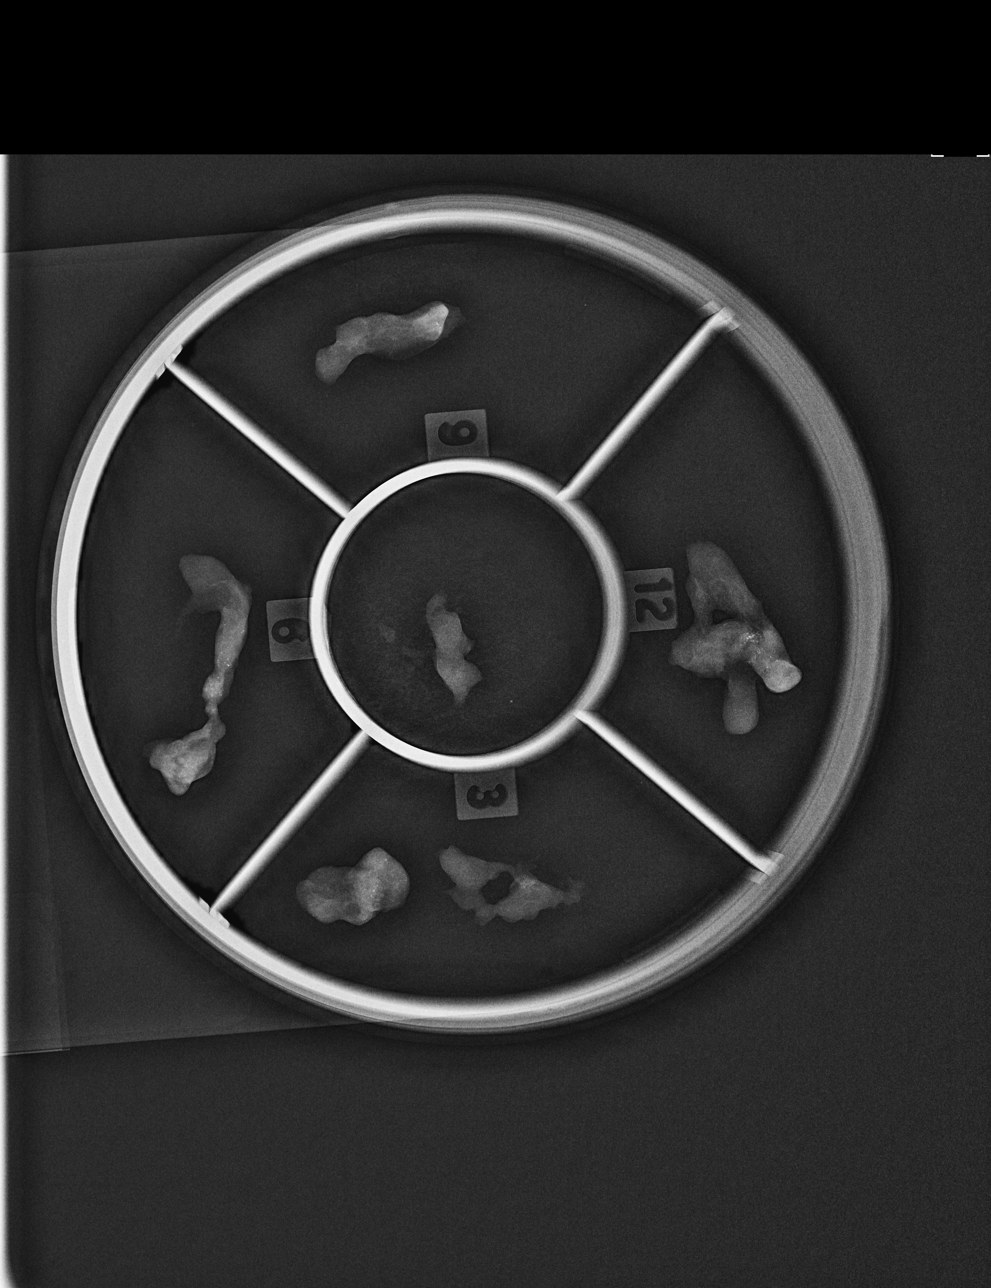

[L ML]
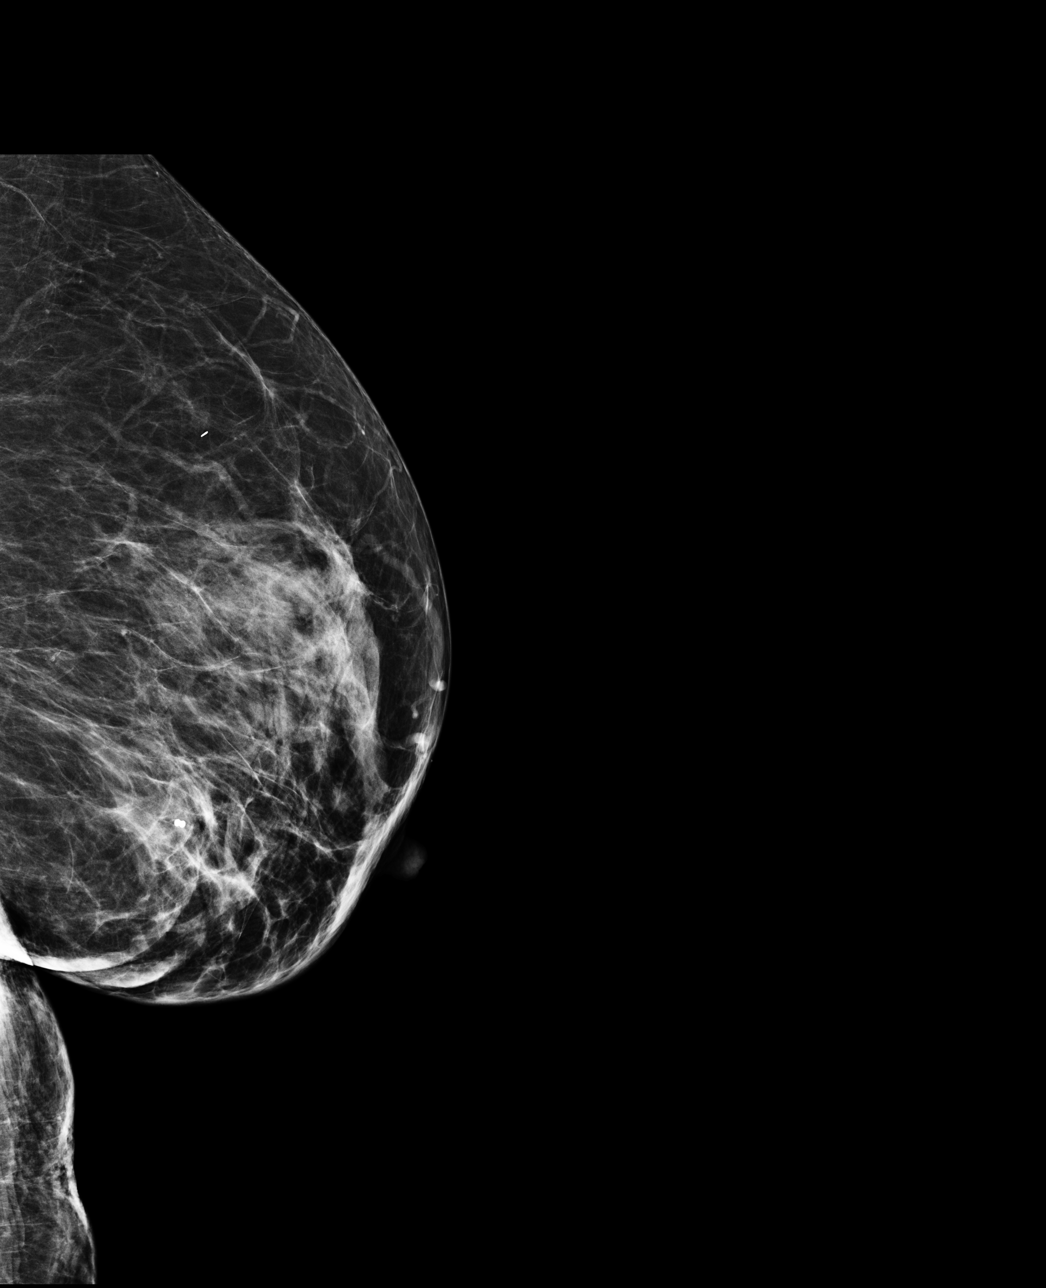

[L CC]
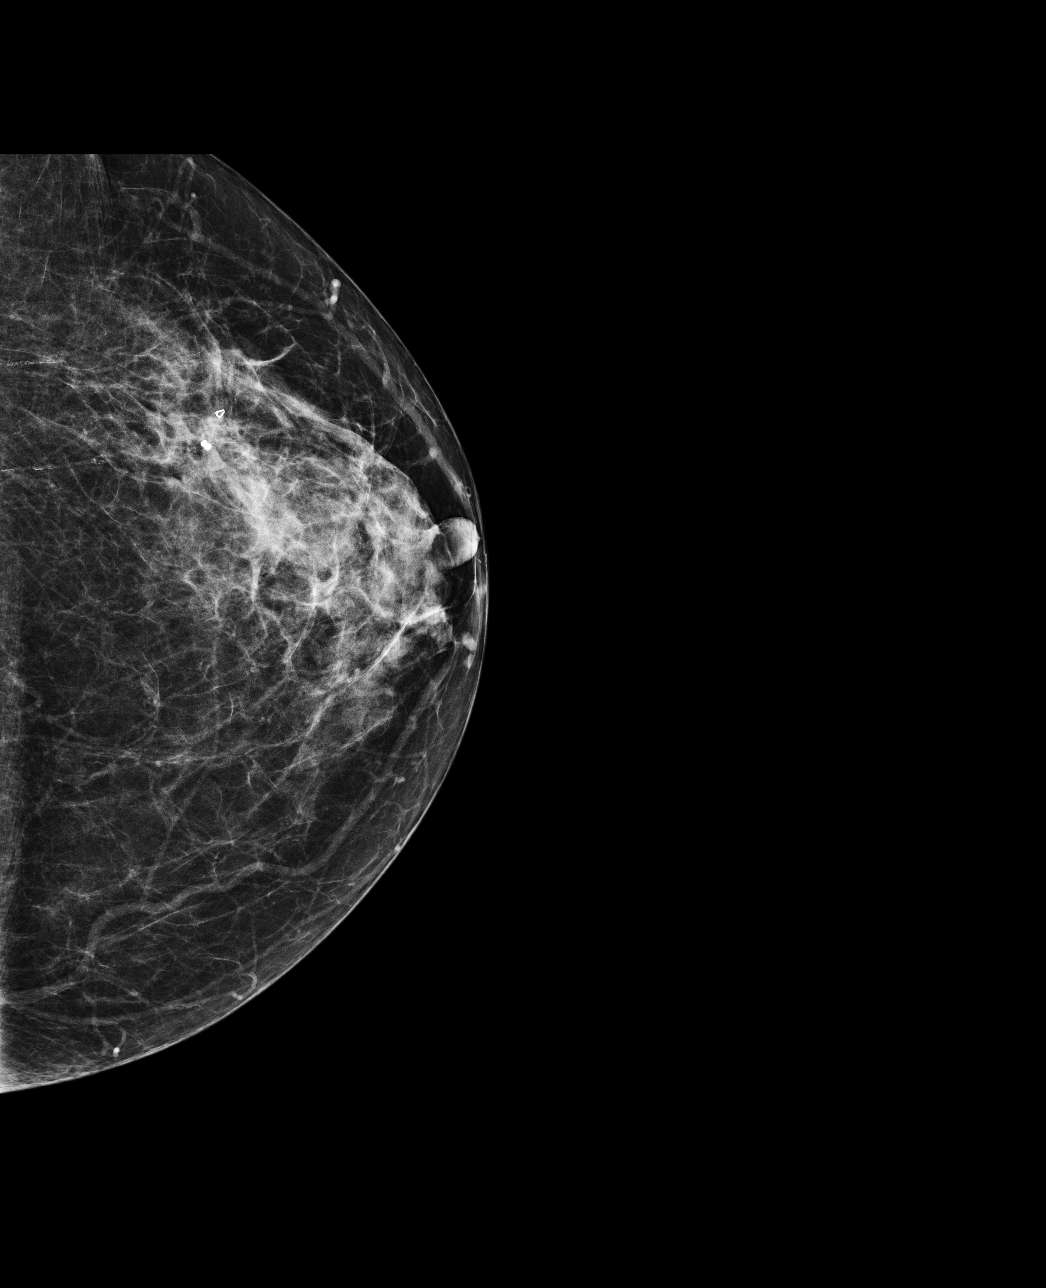

[3 of 3 positions shown; findings below may reference images not displayed]

FINDINGS: I met with the patient and we discussed the procedure of
stereotactic-guided biopsy, including benefits and alternatives. We
discussed the high likelihood of a successful procedure. We
discussed the risks of the procedure, including infection, bleeding,
tissue injury, clip migration, and inadequate sampling. Informed,
written consent was given.

Using sterile technique and 2% Lidocaine as local anesthetic, under
stereotactic guidance, a 12 gauge vacuum assisted device was used to
perform core needle biopsy of calcifications in the lower outer
quadrant of the left breast using a lateral to medial approach.
Specimen radiograph was performed, showing multiple of the targeted
microcalcifications. Specimens with calcifications are identified
for pathology.

At the conclusion of the procedure, a hourglass shaped tissue marker
clip was deployed into the biopsy cavity. Follow-up 2-view mammogram
confirmed clip placement accurately at the biopsy site.
IMPRESSION: Stereotactic-guided biopsy of left breast microcalcifications. No
apparent complications.

## 2015-03-26 ENCOUNTER — Other Ambulatory Visit: Payer: Self-pay | Admitting: Oncology

## 2015-03-26 ENCOUNTER — Other Ambulatory Visit: Payer: Self-pay | Admitting: *Deleted

## 2015-03-26 DIAGNOSIS — C50512 Malignant neoplasm of lower-outer quadrant of left female breast: Secondary | ICD-10-CM

## 2015-03-26 MED ORDER — ANASTROZOLE 1 MG PO TABS: 1.0000 mg | ORAL_TABLET | Freq: Every day | ORAL | Status: DC

## 2015-04-15 DIAGNOSIS — M1310 Monoarthritis, not elsewhere classified, unspecified site: Secondary | ICD-10-CM | POA: Diagnosis not present

## 2015-04-15 DIAGNOSIS — E089 Diabetes mellitus due to underlying condition without complications: Secondary | ICD-10-CM | POA: Diagnosis not present

## 2015-04-15 DIAGNOSIS — I10 Essential (primary) hypertension: Secondary | ICD-10-CM | POA: Diagnosis not present

## 2015-04-15 DIAGNOSIS — E782 Mixed hyperlipidemia: Secondary | ICD-10-CM | POA: Diagnosis not present

## 2015-04-23 ENCOUNTER — Other Ambulatory Visit: Payer: Self-pay | Admitting: *Deleted

## 2015-04-23 DIAGNOSIS — C50512 Malignant neoplasm of lower-outer quadrant of left female breast: Secondary | ICD-10-CM

## 2015-04-24 ENCOUNTER — Ambulatory Visit: Payer: Medicare Other | Admitting: Oncology

## 2015-04-24 ENCOUNTER — Other Ambulatory Visit: Payer: Medicare Other

## 2015-05-15 DIAGNOSIS — I10 Essential (primary) hypertension: Secondary | ICD-10-CM | POA: Diagnosis not present

## 2015-05-15 DIAGNOSIS — M150 Primary generalized (osteo)arthritis: Secondary | ICD-10-CM | POA: Diagnosis not present

## 2015-05-15 DIAGNOSIS — E089 Diabetes mellitus due to underlying condition without complications: Secondary | ICD-10-CM | POA: Diagnosis not present

## 2015-06-18 DIAGNOSIS — Z0000 Encounter for general adult medical examination without abnormal findings: Secondary | ICD-10-CM | POA: Diagnosis not present

## 2015-06-18 DIAGNOSIS — M130 Polyarthritis, unspecified: Secondary | ICD-10-CM | POA: Diagnosis not present

## 2015-06-18 DIAGNOSIS — E782 Mixed hyperlipidemia: Secondary | ICD-10-CM | POA: Diagnosis not present

## 2015-06-18 DIAGNOSIS — I10 Essential (primary) hypertension: Secondary | ICD-10-CM | POA: Diagnosis not present

## 2015-07-12 ENCOUNTER — Other Ambulatory Visit: Payer: Self-pay | Admitting: Oncology

## 2015-07-23 DIAGNOSIS — E785 Hyperlipidemia, unspecified: Secondary | ICD-10-CM | POA: Diagnosis not present

## 2015-07-23 DIAGNOSIS — M130 Polyarthritis, unspecified: Secondary | ICD-10-CM | POA: Diagnosis not present

## 2015-07-23 DIAGNOSIS — E089 Diabetes mellitus due to underlying condition without complications: Secondary | ICD-10-CM | POA: Diagnosis not present

## 2015-07-23 DIAGNOSIS — I10 Essential (primary) hypertension: Secondary | ICD-10-CM | POA: Diagnosis not present

## 2015-09-23 DIAGNOSIS — A938 Other specified arthropod-borne viral fevers: Secondary | ICD-10-CM | POA: Diagnosis not present

## 2015-09-23 DIAGNOSIS — E782 Mixed hyperlipidemia: Secondary | ICD-10-CM | POA: Diagnosis not present

## 2015-09-23 DIAGNOSIS — M1310 Monoarthritis, not elsewhere classified, unspecified site: Secondary | ICD-10-CM | POA: Diagnosis not present

## 2015-09-23 DIAGNOSIS — M130 Polyarthritis, unspecified: Secondary | ICD-10-CM | POA: Diagnosis not present

## 2015-09-23 DIAGNOSIS — E0800 Diabetes mellitus due to underlying condition with hyperosmolarity without nonketotic hyperglycemic-hyperosmolar coma (NKHHC): Secondary | ICD-10-CM | POA: Diagnosis not present

## 2015-10-07 ENCOUNTER — Other Ambulatory Visit: Payer: Self-pay | Admitting: Oncology

## 2015-10-07 DIAGNOSIS — C50512 Malignant neoplasm of lower-outer quadrant of left female breast: Secondary | ICD-10-CM

## 2015-11-03 ENCOUNTER — Other Ambulatory Visit: Payer: Self-pay | Admitting: Oncology

## 2015-11-03 DIAGNOSIS — C50512 Malignant neoplasm of lower-outer quadrant of left female breast: Secondary | ICD-10-CM

## 2015-11-25 ENCOUNTER — Other Ambulatory Visit: Payer: Self-pay | Admitting: Family Medicine

## 2015-11-25 DIAGNOSIS — Z853 Personal history of malignant neoplasm of breast: Secondary | ICD-10-CM

## 2015-11-29 ENCOUNTER — Other Ambulatory Visit: Payer: Self-pay | Admitting: Oncology

## 2015-11-29 DIAGNOSIS — C50512 Malignant neoplasm of lower-outer quadrant of left female breast: Secondary | ICD-10-CM

## 2016-01-05 ENCOUNTER — Other Ambulatory Visit: Payer: Self-pay | Admitting: *Deleted

## 2016-01-05 MED ORDER — ANASTROZOLE 1 MG PO TABS: 1.0000 mg | ORAL_TABLET | Freq: Every day | ORAL | 2 refills | Status: DC

## 2016-01-06 ENCOUNTER — Telehealth: Payer: Self-pay | Admitting: Oncology

## 2016-01-06 NOTE — Telephone Encounter (Signed)
lvm to inform pt of 10/18 appt date/time per LOS

## 2016-01-12 ENCOUNTER — Telehealth: Payer: Self-pay | Admitting: Oncology

## 2016-01-12 NOTE — Telephone Encounter (Signed)
10/18 Appointment rescheduled to 11/09 per patient request. The patient stated that she has prior appointments around the time of original scheduled appointments.

## 2016-01-13 DIAGNOSIS — Z853 Personal history of malignant neoplasm of breast: Secondary | ICD-10-CM | POA: Diagnosis not present

## 2016-01-14 ENCOUNTER — Ambulatory Visit: Payer: Medicare Other | Admitting: Oncology

## 2016-01-14 ENCOUNTER — Other Ambulatory Visit: Payer: Medicare Other

## 2016-01-16 ENCOUNTER — Ambulatory Visit
Admission: RE | Admit: 2016-01-16 | Discharge: 2016-01-16 | Disposition: A | Discharge status: Home / Self Care | Payer: Medicare Other | Source: Ambulatory Visit | Attending: Family Medicine | Admitting: Family Medicine

## 2016-01-16 DIAGNOSIS — Z853 Personal history of malignant neoplasm of breast: Secondary | ICD-10-CM

## 2016-01-16 DIAGNOSIS — R928 Other abnormal and inconclusive findings on diagnostic imaging of breast: Secondary | ICD-10-CM | POA: Diagnosis not present

## 2016-01-27 ENCOUNTER — Other Ambulatory Visit: Payer: Self-pay

## 2016-02-05 ENCOUNTER — Other Ambulatory Visit (HOSPITAL_BASED_OUTPATIENT_CLINIC_OR_DEPARTMENT_OTHER): Payer: Medicare Other

## 2016-02-05 ENCOUNTER — Ambulatory Visit (HOSPITAL_BASED_OUTPATIENT_CLINIC_OR_DEPARTMENT_OTHER): Payer: Medicare Other | Admitting: Oncology

## 2016-02-05 VITALS — BP 169/85 | HR 90 | Temp 98.6°F | Resp 18 | Ht 64.0 in | Wt 206.9 lb

## 2016-02-05 DIAGNOSIS — Z86000 Personal history of in-situ neoplasm of breast: Secondary | ICD-10-CM | POA: Diagnosis not present

## 2016-02-05 DIAGNOSIS — Z170 Estrogen receptor positive status [ER+]: Secondary | ICD-10-CM | POA: Diagnosis not present

## 2016-02-05 DIAGNOSIS — C50512 Malignant neoplasm of lower-outer quadrant of left female breast: Secondary | ICD-10-CM

## 2016-02-05 LAB — COMPREHENSIVE METABOLIC PANEL
ALT: 12 U/L (ref 0–55)
ANION GAP: 11 meq/L (ref 3–11)
AST: 17 U/L (ref 5–34)
Albumin: 3.8 g/dL (ref 3.5–5.0)
Alkaline Phosphatase: 78 U/L (ref 40–150)
BUN: 14.4 mg/dL (ref 7.0–26.0)
CHLORIDE: 104 meq/L (ref 98–109)
CO2: 26 meq/L (ref 22–29)
Calcium: 9.9 mg/dL (ref 8.4–10.4)
Creatinine: 0.9 mg/dL (ref 0.6–1.1)
EGFR: 72 mL/min/{1.73_m2} — AB (ref >90)
GLUCOSE: 111 mg/dL (ref 70–140)
POTASSIUM: 4.7 meq/L (ref 3.5–5.1)
SODIUM: 141 meq/L (ref 136–145)
Total Bilirubin: 0.37 mg/dL (ref 0.20–1.20)
Total Protein: 8.6 g/dL — ABNORMAL HIGH (ref 6.4–8.3)

## 2016-02-05 LAB — CBC WITH DIFFERENTIAL/PLATELET
BASO%: 0.4 % (ref 0.0–2.0)
Basophils Absolute: 0 10*3/uL (ref 0.0–0.1)
EOS ABS: 0.1 10*3/uL (ref 0.0–0.5)
EOS%: 1.9 % (ref 0.0–7.0)
HCT: 41.3 % (ref 34.8–46.6)
HGB: 13.8 g/dL (ref 11.6–15.9)
LYMPH%: 18.9 % (ref 14.0–49.7)
MCH: 30.5 pg (ref 25.1–34.0)
MCHC: 33.4 g/dL (ref 31.5–36.0)
MCV: 91.4 fL (ref 79.5–101.0)
MONO#: 0.5 10*3/uL (ref 0.1–0.9)
MONO%: 10.5 % (ref 0.0–14.0)
NEUT%: 68.3 % (ref 38.4–76.8)
NEUTROS ABS: 3.2 10*3/uL (ref 1.5–6.5)
Platelets: 377 10*3/uL (ref 145–400)
RBC: 4.52 10*6/uL (ref 3.70–5.45)
RDW: 13.7 % (ref 11.2–14.5)
WBC: 4.8 10*3/uL (ref 3.9–10.3)
lymph#: 0.9 10*3/uL (ref 0.9–3.3)

## 2016-02-05 MED ORDER — ANASTROZOLE 1 MG PO TABS: 1.0000 mg | ORAL_TABLET | Freq: Every day | ORAL | 4 refills | Status: DC

## 2016-02-05 NOTE — Progress Notes (Signed)
ID: [name] OB: 1940/06/06  MR#: [id number]  CSN#:[id number]  PCP: Elyn Peers, MD GYN:   SU:  Jackolyn Confer, MD OTHER MD: Thea Silversmith, MD  CHIEF COMPLAINT: Estrogen receptor positive breast cancer   CURRENT TREATMENT: Anastrozole   HISTORY OF PRESENT ILLNESS: From the original indignant:  The patient has yearly screening mammography, and on her screening mammogram at the breast center 12/21/2012 new calcifications in the left lower breast were noted. 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  Breast MRI was obtained 01/19/2013 and showed an area of spiculated abnormal enhancement in the lateral lower position of the left breast measuring 3.2 cm maximally. There was no abnormal appearing adenopathy. Iincidentally focally increased signal in the right manubrium and medial right clavicle was noted.   The patient's subsequent history is as detailed below.  INTERVAL HISTORY: Erica Ballard returns  Today for follow-up of her estrogen receptor positive breast cancer. She continues and anastrozole. She tolerates it well.Hot flashes and vaginal dryness are not a major issue. She never developed the arthralgias or myalgias that many patients can experience on this medication. She obtains it at a good price.   REVIEW OF SYSTEMS: Erica Ballard Sometimes has a little bit of discomfort in the left breast area when she "overdoes it" or lifts a heavy weight. Otherwise she has no breast problems that she is aware of. She is now retired. 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 A detailed review of systems today was otherwise stable.  PAST MEDICAL HISTORY: Past Medical History:  Diagnosis Date  . Arthritis   . Breast cancer   . Diabetes mellitus without complication   . Dysrhythmia    palpitations  . Hypertension   . Radiation 03/14/13-04/19/13   Left breast 25 fractions    PAST SURGICAL HISTORY: Past Surgical History:  Procedure Laterality Date  . ABDOMINAL HYSTERECTOMY    . BREAST LUMPECTOMY WITH NEEDLE LOCALIZATION Left 02/06/2013   Procedure: BREAST LUMPECTOMY WITH NEEDLE LOCALIZATION;  Surgeon: Odis Hollingshead, MD;  Location: [facility];  Service: General;  Laterality: Left;  . EYE SURGERY     cataracts    FAMILY HISTORY Family History  Problem Relation Age of Onset  . Lung cancer Sister    the patient has very little information about her parents. She has 3 brothers and 2 sisters. One sister died at the age of 47 from lung cancer. There is no history of breast or ovarian cancer in the family to the patient's knowledge.  GYNECOLOGIC HISTORY:     (updated 07/16/2013) Menarche age 8, first live birth age 75. The patient is GX P4. She stopped having periods approximately 1972. She never took hormone replacement. She never took contraceptives.  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 Her 4 children are: [employer] who works as a Engineer, site in [place]; Lin Givens, who is a Chartered certified accountant; Clide Cliff, who is an Clinical biochemist; and Sararose Carn who is a Biochemist, clinical. The patient has 4 grandchildren and one great-grandchild. She attends a local [employer]: Not in place; the patient is considering name and her daughter Peter Congo as healthcare power of attorney. Or you can be reached at [phone], [phone], or [phone].  HEALTH MAINTENANCE:  (updated 07/16/2013) Social History  Substance Use Topics  . Smoking status: Former Smoker    Packs/day: 0.25    Years: 2.00    Types: Cigarettes    Quit date: 02/03/2003  . Smokeless tobacco: Not on file     Comment: occ alcohol  . Alcohol use Yes     Colonoscopy: 2009  PAP: 2010  Bone density: 2005,  at [facility] hospital; normal  Lipid panel: Not on file/Dr. Criss Rosales   No Known Allergies  Current Outpatient Prescriptions  Medication Sig Dispense Refill  . metFORMIN (GLUCOPHAGE) 500 MG tablet Take by mouth 2 (two) times daily with a meal.    . amLODipine (NORVASC) 5 MG tablet Take 5 mg by mouth 2 (two) times daily.    Marland Kitchen anastrozole (ARIMIDEX) 1 MG tablet Take 1 tablet (1 mg total) by mouth daily. 90 tablet 4  . carvedilol (COREG) 6.25 MG tablet Take 6.25 mg by mouth 2 (two) times daily.    . hydrochlorothiazide (HYDRODIURIL) 25 MG tablet Take 25 mg by mouth daily.    Marland Kitchen JANUMET 50-500 MG per tablet Take 1 tablet by mouth 2 (two) times daily with a meal.     . ketoconazole (NIZORAL) 2 % shampoo     . Lancets 30G MISC     . metFORMIN (GLUCOPHAGE) 1000 MG tablet TAKE 1 TABLET BY MOUTH EVERY DAY FOR DIABETES  1  . ONETOUCH VERIO test strip USE AS DIRECTED DAILY TESTING  1  . spironolactone (ALDACTONE) 25 MG tablet Take 25 mg by mouth 2 (two) times daily.     No current facility-administered medications for this visit.     OBJECTIVE:  middle-aged  African American woman Who appears stated age 75:   02/05/16 1350  BP: (!) 169/85  Pulse: 90  Resp: 18  Temp: 98.6 F (37 C)     Body mass index is 35.51 kg/m.    ECOG FS: 1 Filed Weights   02/05/16 1350  Weight: 206 lb 14.4 oz (93.8 kg)   Sclerae unicteric, pupils round and equal Oropharynx clear and moist-- no thrush or other lesions No cervical or supraclavicular adenopathy Lungs no rales or rhonchi Heart regular rate and rhythm Abd soft, nontender, positive bowel sounds MSK no focal spinal tenderness, no upper extremity lymphedema Neuro: nonfocal, well oriented, appropriate affect Breasts: The right breast is benign. The left breast is status post lumpectomy, followed by radiation. The left axilla is benign.  LAB RESULTS:  Lab Results  Component Value Date   WBC 4.8 02/05/2016   NEUTROABS 3.2 02/05/2016   HGB 13.8  02/05/2016   HCT 41.3 02/05/2016   MCV 91.4 02/05/2016   PLT 377 02/05/2016      Chemistry      Component Value Date/Time   NA 141 02/05/2016 1314   K 4.7 02/05/2016 1314   CL 100 02/02/2013 1201   CO2 26 02/05/2016 1314   BUN 14.4 02/05/2016 1314   CREATININE 0.9 02/05/2016 1314      Component Value Date/Time   CALCIUM 9.9 02/05/2016 1314   ALKPHOS 78 02/05/2016 1314   AST 17 02/05/2016 1314   ALT 12 02/05/2016 1314   BILITOT 0.37 02/05/2016 1314       STUDIES: Mm Diag Breast Tomo Bilateral  Result Date: 01/16/2016 CLINICAL DATA:  History of treated left breast cancer, status post lumpectomy and radiation therapy in 2014.Patient is asymptomatic. EXAM: 2D DIGITAL DIAGNOSTIC BILATERAL MAMMOGRAM WITH CAD AND ADJUNCT TOMO COMPARISON:  Previous exam(s). ACR Breast Density Category b: There are scattered areas of fibroglandular density. FINDINGS: Mammographically, there are no suspicious masses, areas of nonsurgical architectural distortion or microcalcifications in either breast. There are stable postsurgical and posttreatment changes in the left breast. Mammographic images were processed with CAD. IMPRESSION: No mammographic evidence of malignancy in either breast, status post left lumpectomy. RECOMMENDATION: Diagnostic mammogram is suggested in 1 year. (Code:DM-B-01Y) I have discussed the findings and recommendations with the patient. Results were also provided in writing at the conclusion of the visit. If applicable, a reminder letter will be sent to the patient regarding the next appointment. BI-RADS CATEGORY  2: Benign. Electronically Signed   By: Fidela Salisbury M.D.   On: 01/16/2016 10:49     ASSESSMENT: 75 y.o. [place] woman status post left breast lower outer quadrant biopsy 01/08/2013 for a low-grade ductal carcinoma in situ which was 100% estrogen receptor positive, 10% progesterone receptor positive.  (1). Status post left lumpectomy 02/06/2013 showing ductal  carcinoma in situ measuring 2.7 cm, grade 1, with negative margins  (2) adjuvant radiation, completed 04/19/2013  (3) started anastrozole in late January 2015; bone density at [facility] 12/24/2013 was normal   PLAN: Sherissa is now 3 years out from definitive surgery for her breast cancer with no evidence of disease recurrence. This is very favorable.  She is tolerating the anastrozole well. The plan will be to continue that for a total of 5 years, after which point she will be released from follow-up.  We reviewed her recent mammogram, which shows a low breast density, which is favorable.  She will see me again a year from now. She knows to call for any problem that may develop before her next visit. Chauncey Cruel, MD   02/05/2016 2:06 PM

## 2016-02-24 DIAGNOSIS — M1310 Monoarthritis, not elsewhere classified, unspecified site: Secondary | ICD-10-CM | POA: Diagnosis not present

## 2016-02-24 DIAGNOSIS — E782 Mixed hyperlipidemia: Secondary | ICD-10-CM | POA: Diagnosis not present

## 2016-02-24 DIAGNOSIS — E0800 Diabetes mellitus due to underlying condition with hyperosmolarity without nonketotic hyperglycemic-hyperosmolar coma (NKHHC): Secondary | ICD-10-CM | POA: Diagnosis not present

## 2016-04-07 DIAGNOSIS — I10 Essential (primary) hypertension: Secondary | ICD-10-CM | POA: Diagnosis not present

## 2016-05-21 DIAGNOSIS — I10 Essential (primary) hypertension: Secondary | ICD-10-CM | POA: Diagnosis not present

## 2016-05-21 DIAGNOSIS — E089 Diabetes mellitus due to underlying condition without complications: Secondary | ICD-10-CM | POA: Diagnosis not present

## 2016-05-21 DIAGNOSIS — M150 Primary generalized (osteo)arthritis: Secondary | ICD-10-CM | POA: Diagnosis not present

## 2016-05-21 DIAGNOSIS — E782 Mixed hyperlipidemia: Secondary | ICD-10-CM | POA: Diagnosis not present

## 2016-05-21 DIAGNOSIS — Z0000 Encounter for general adult medical examination without abnormal findings: Secondary | ICD-10-CM | POA: Diagnosis not present

## 2016-07-20 DIAGNOSIS — E782 Mixed hyperlipidemia: Secondary | ICD-10-CM | POA: Diagnosis not present

## 2016-07-20 DIAGNOSIS — I10 Essential (primary) hypertension: Secondary | ICD-10-CM | POA: Diagnosis not present

## 2016-07-20 DIAGNOSIS — E119 Type 2 diabetes mellitus without complications: Secondary | ICD-10-CM | POA: Diagnosis not present

## 2016-07-20 DIAGNOSIS — C50119 Malignant neoplasm of central portion of unspecified female breast: Secondary | ICD-10-CM | POA: Diagnosis not present

## 2016-07-20 DIAGNOSIS — C50111 Malignant neoplasm of central portion of right female breast: Secondary | ICD-10-CM | POA: Diagnosis not present

## 2016-07-20 DIAGNOSIS — M130 Polyarthritis, unspecified: Secondary | ICD-10-CM | POA: Diagnosis not present

## 2016-11-23 DIAGNOSIS — I10 Essential (primary) hypertension: Secondary | ICD-10-CM | POA: Diagnosis not present

## 2016-11-23 DIAGNOSIS — M130 Polyarthritis, unspecified: Secondary | ICD-10-CM | POA: Diagnosis not present

## 2016-11-23 DIAGNOSIS — E782 Mixed hyperlipidemia: Secondary | ICD-10-CM | POA: Diagnosis not present

## 2016-11-23 DIAGNOSIS — E785 Hyperlipidemia, unspecified: Secondary | ICD-10-CM | POA: Diagnosis not present

## 2016-11-23 DIAGNOSIS — E089 Diabetes mellitus due to underlying condition without complications: Secondary | ICD-10-CM | POA: Diagnosis not present

## 2016-12-07 ENCOUNTER — Other Ambulatory Visit: Payer: Self-pay | Admitting: Family Medicine

## 2016-12-07 DIAGNOSIS — Z853 Personal history of malignant neoplasm of breast: Secondary | ICD-10-CM

## 2017-01-17 ENCOUNTER — Ambulatory Visit
Admission: RE | Admit: 2017-01-17 | Discharge: 2017-01-17 | Disposition: A | Discharge status: Home / Self Care | Payer: Medicare Other | Source: Ambulatory Visit | Attending: Family Medicine | Admitting: Family Medicine

## 2017-01-17 DIAGNOSIS — R928 Other abnormal and inconclusive findings on diagnostic imaging of breast: Secondary | ICD-10-CM | POA: Diagnosis not present

## 2017-01-17 DIAGNOSIS — Z853 Personal history of malignant neoplasm of breast: Secondary | ICD-10-CM

## 2017-01-17 HISTORY — DX: Personal history of irradiation: Z92.3

## 2017-02-02 ENCOUNTER — Other Ambulatory Visit: Payer: Self-pay

## 2017-02-02 DIAGNOSIS — Z170 Estrogen receptor positive status [ER+]: Principal | ICD-10-CM

## 2017-02-02 DIAGNOSIS — C50512 Malignant neoplasm of lower-outer quadrant of left female breast: Secondary | ICD-10-CM

## 2017-02-03 ENCOUNTER — Ambulatory Visit: Payer: Medicare Other | Admitting: Oncology

## 2017-02-03 ENCOUNTER — Other Ambulatory Visit: Payer: Medicare Other

## 2017-03-28 DIAGNOSIS — I10 Essential (primary) hypertension: Secondary | ICD-10-CM | POA: Diagnosis not present

## 2017-03-28 DIAGNOSIS — M130 Polyarthritis, unspecified: Secondary | ICD-10-CM | POA: Diagnosis not present

## 2017-03-28 DIAGNOSIS — E785 Hyperlipidemia, unspecified: Secondary | ICD-10-CM | POA: Diagnosis not present

## 2017-04-28 ENCOUNTER — Other Ambulatory Visit: Payer: Self-pay | Admitting: *Deleted

## 2017-04-29 ENCOUNTER — Other Ambulatory Visit: Payer: Self-pay

## 2017-04-29 MED ORDER — ANASTROZOLE 1 MG PO TABS: 1.0000 mg | ORAL_TABLET | Freq: Every day | ORAL | 3 refills | Status: AC

## 2017-05-27 DIAGNOSIS — M130 Polyarthritis, unspecified: Secondary | ICD-10-CM | POA: Diagnosis not present

## 2017-05-27 DIAGNOSIS — I10 Essential (primary) hypertension: Secondary | ICD-10-CM | POA: Diagnosis not present

## 2017-05-27 DIAGNOSIS — E782 Mixed hyperlipidemia: Secondary | ICD-10-CM | POA: Diagnosis not present

## 2017-05-27 DIAGNOSIS — C50119 Malignant neoplasm of central portion of unspecified female breast: Secondary | ICD-10-CM | POA: Diagnosis not present

## 2017-05-27 DIAGNOSIS — E089 Diabetes mellitus due to underlying condition without complications: Secondary | ICD-10-CM | POA: Diagnosis not present

## 2017-05-27 DIAGNOSIS — E7800 Pure hypercholesterolemia, unspecified: Secondary | ICD-10-CM | POA: Diagnosis not present

## 2017-07-26 DIAGNOSIS — I10 Essential (primary) hypertension: Secondary | ICD-10-CM | POA: Diagnosis not present

## 2017-08-09 DIAGNOSIS — I10 Essential (primary) hypertension: Secondary | ICD-10-CM | POA: Diagnosis not present

## 2017-08-09 DIAGNOSIS — I119 Hypertensive heart disease without heart failure: Secondary | ICD-10-CM | POA: Diagnosis not present

## 2017-08-30 DIAGNOSIS — E782 Mixed hyperlipidemia: Secondary | ICD-10-CM | POA: Diagnosis not present

## 2017-08-30 DIAGNOSIS — M130 Polyarthritis, unspecified: Secondary | ICD-10-CM | POA: Diagnosis not present

## 2017-08-30 DIAGNOSIS — I10 Essential (primary) hypertension: Secondary | ICD-10-CM | POA: Diagnosis not present

## 2017-10-12 DIAGNOSIS — I119 Hypertensive heart disease without heart failure: Secondary | ICD-10-CM | POA: Diagnosis not present

## 2017-10-12 DIAGNOSIS — E782 Mixed hyperlipidemia: Secondary | ICD-10-CM | POA: Diagnosis not present

## 2017-10-12 DIAGNOSIS — I10 Essential (primary) hypertension: Secondary | ICD-10-CM | POA: Diagnosis not present

## 2017-10-12 DIAGNOSIS — E1100 Type 2 diabetes mellitus with hyperosmolarity without nonketotic hyperglycemic-hyperosmolar coma (NKHHC): Secondary | ICD-10-CM | POA: Diagnosis not present

## 2017-11-02 DIAGNOSIS — R404 Transient alteration of awareness: Secondary | ICD-10-CM | POA: Diagnosis not present

## 2017-11-27 DIAGNOSIS — 419620001 Death: Secondary | SNOMED CT | POA: Diagnosis not present

## 2017-11-27 DEATH — deceased
